# Patient Record
Sex: Male | Born: 1960 | Race: Black or African American | Hispanic: No | Marital: Single | Smoking: Never smoker
Health system: Southern US, Community
[De-identification: ages and names within clinical notes are randomized; demographics above are authoritative.]

## PROBLEM LIST (undated history)

## (undated) DIAGNOSIS — I10 Essential (primary) hypertension: Secondary | ICD-10-CM

## (undated) DIAGNOSIS — M1990 Unspecified osteoarthritis, unspecified site: Secondary | ICD-10-CM

## (undated) DIAGNOSIS — T7840XA Allergy, unspecified, initial encounter: Secondary | ICD-10-CM

## (undated) DIAGNOSIS — Z5189 Encounter for other specified aftercare: Secondary | ICD-10-CM

## (undated) HISTORY — DX: Unspecified osteoarthritis, unspecified site: M19.90

## (undated) HISTORY — DX: Essential (primary) hypertension: I10

## (undated) HISTORY — DX: Encounter for other specified aftercare: Z51.89

## (undated) HISTORY — PX: JOINT REPLACEMENT: SHX530

## (undated) HISTORY — DX: Allergy, unspecified, initial encounter: T78.40XA

---

## 2006-08-27 ENCOUNTER — Emergency Department (HOSPITAL_COMMUNITY): Admission: EM | Admit: 2006-08-27 | Discharge: 2006-08-27 | Payer: Self-pay | Admitting: Emergency Medicine

## 2007-11-11 HISTORY — PX: REPLACEMENT TOTAL KNEE BILATERAL: SUR1225

## 2007-12-20 ENCOUNTER — Ambulatory Visit: Payer: Self-pay | Admitting: General Practice

## 2007-12-31 ENCOUNTER — Ambulatory Visit: Payer: Self-pay | Admitting: Internal Medicine

## 2008-01-04 ENCOUNTER — Inpatient Hospital Stay: Payer: Self-pay | Admitting: General Practice

## 2008-01-21 ENCOUNTER — Emergency Department: Payer: Self-pay | Admitting: Unknown Physician Specialty

## 2008-01-21 ENCOUNTER — Other Ambulatory Visit: Payer: Self-pay

## 2009-06-19 IMAGING — CR DG KNEE 1-2V*R*
1 series · 2 of 2 positions shown · non-contrast
Comparison: none

REASON FOR EXAM: Post operative
COMMENTS:   Bedside (portable):Y

PROCEDURE:     DXR - DXR KNEE RIGHT AP AND LATERAL  - January 04, 2008  [DATE]
RESULT:     Images of the RIGHT knee show the patient is status post RIGHT
knee arthroplasty. The bony and hardware structures appear to be
unremarkable. Surgical drains and skin staples are present.

[Series 1: view not recorded · 0.17mm/px · 2 of 2 slices shown]
[im 1/2]
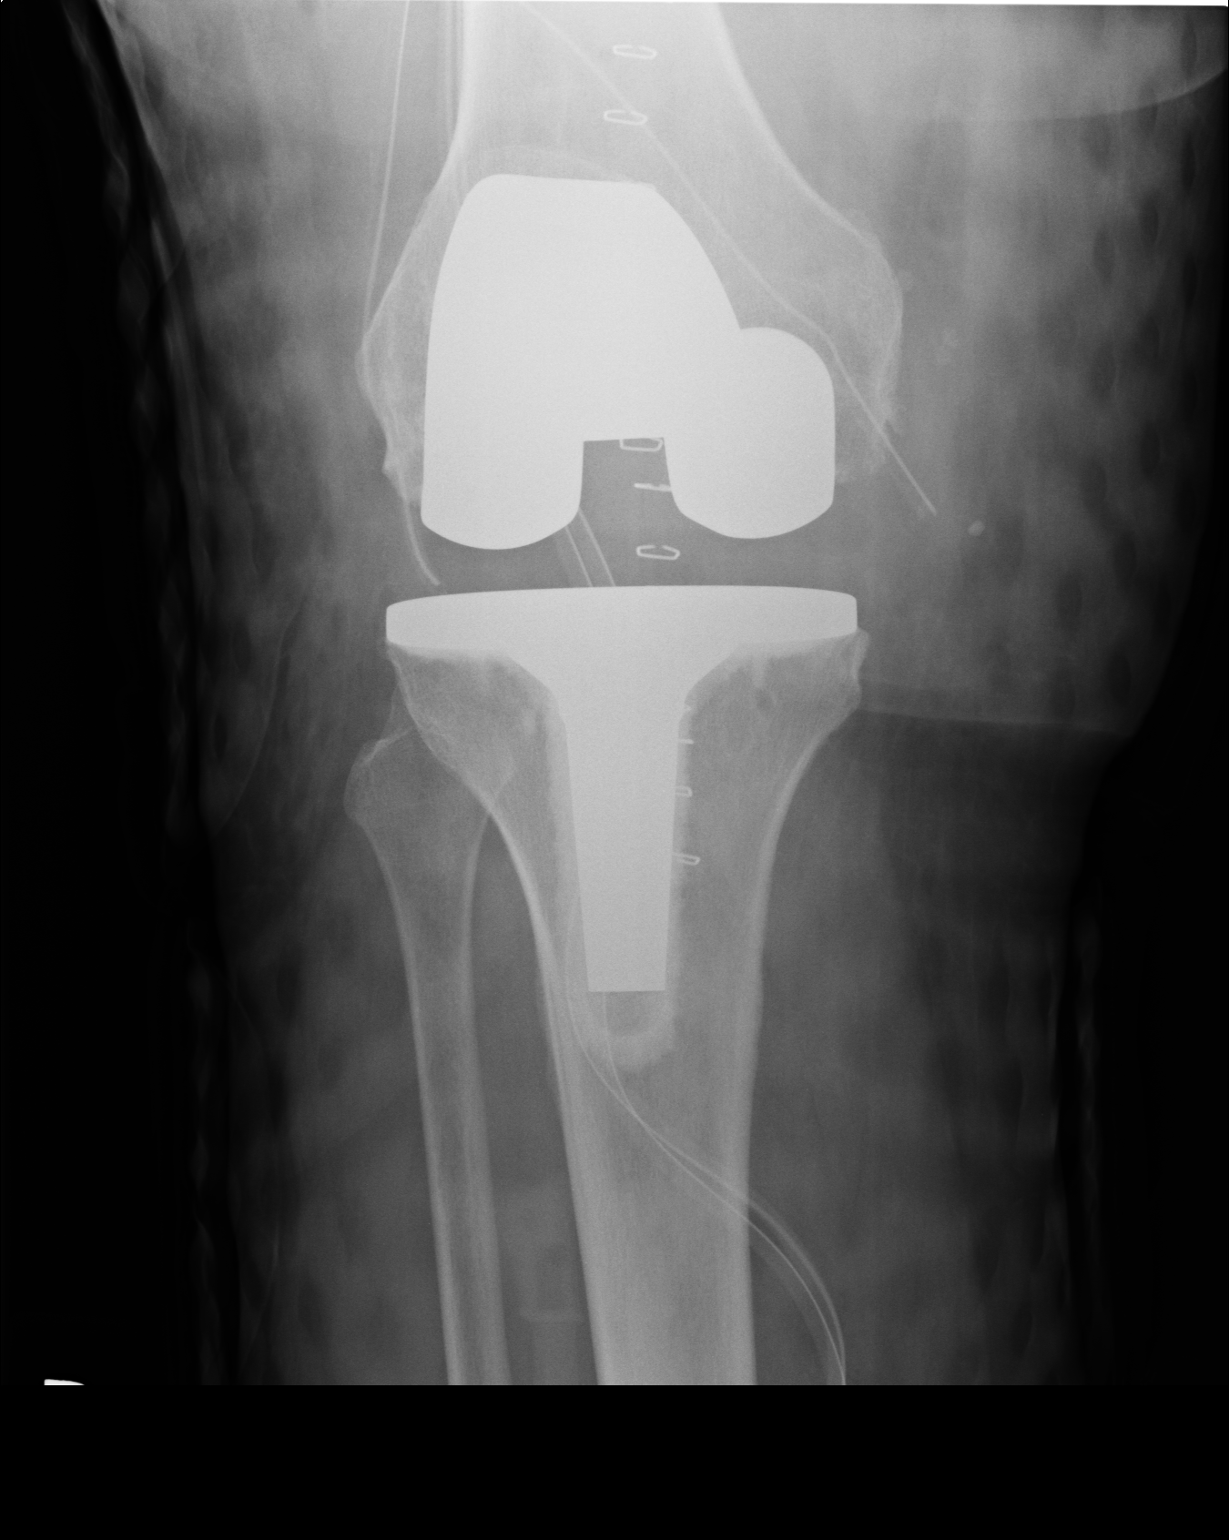
[im 2/2]
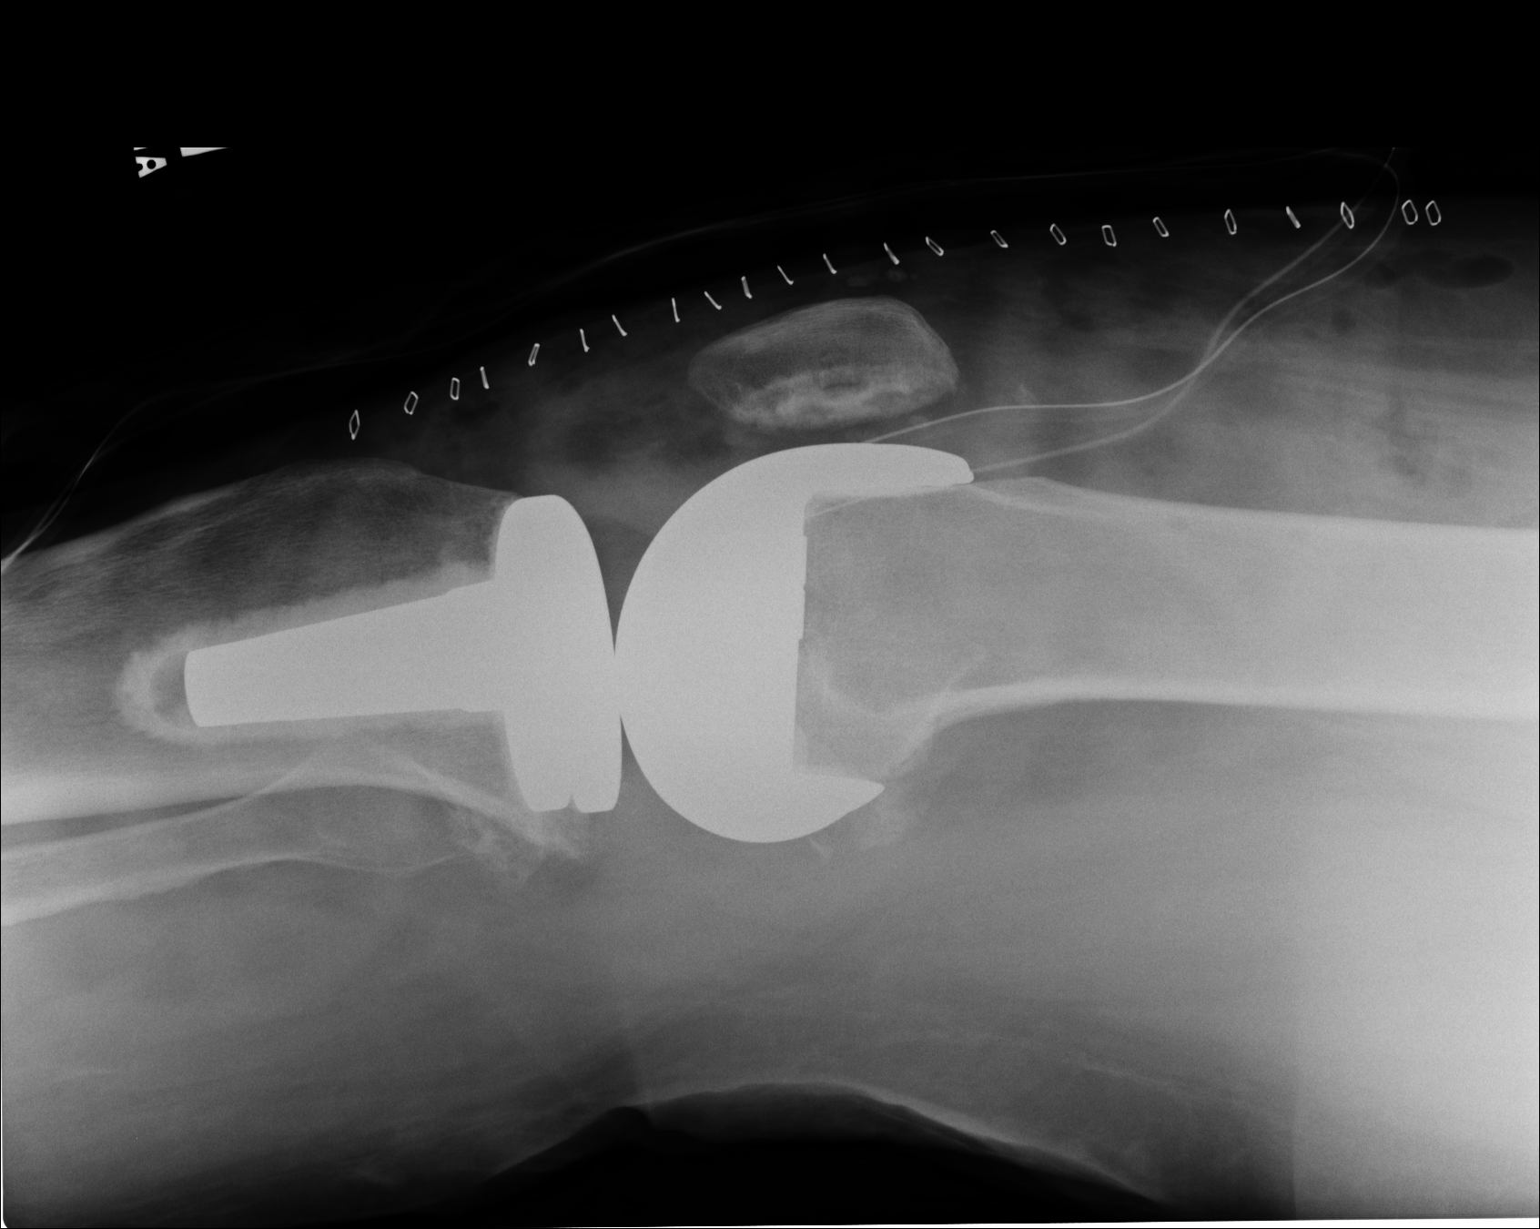

[2 of 2 positions shown; findings below may reference images not displayed]

IMPRESSION: Status post RIGHT knee arthroplasty. No immediate post
operative bone or hardware complication is demonstrated.

## 2009-11-10 HISTORY — PX: COLONOSCOPY: SHX174

## 2010-09-23 ENCOUNTER — Ambulatory Visit: Payer: Self-pay | Admitting: Gastroenterology

## 2010-09-23 LAB — HM COLONOSCOPY: HM COLON: NORMAL

## 2010-10-27 ENCOUNTER — Ambulatory Visit: Payer: Self-pay | Admitting: Internal Medicine

## 2010-12-19 ENCOUNTER — Ambulatory Visit: Payer: Self-pay | Admitting: Internal Medicine

## 2011-11-11 HISTORY — PX: ESOPHAGOGASTRODUODENOSCOPY: SHX1529

## 2012-05-03 ENCOUNTER — Ambulatory Visit: Payer: Self-pay | Admitting: Internal Medicine

## 2013-04-20 ENCOUNTER — Emergency Department: Payer: Self-pay | Admitting: Emergency Medicine

## 2013-10-17 IMAGING — US US ABDOMEN LIMITED SLG ORGAN/ASCITES
1 series · 14 of 25 positions shown · non-contrast
Comparison: none

REASON FOR EXAM: RUQ Epigastric Pain
COMMENTS:

PROCEDURE:     ESQUIVEL - ESQUIVEL ABDOMEN LTD 1 ORGAN OR QUAD  - May 03, 2012  [DATE]
RESULT:     Liver is normal. The pancreas is only partially visualized due
to overlying bowel gas. No focal abnormality noted. Gallbladder is normal.
Common bile duct diameter is 2.8 mm.

[Series 1: us abdomen limited slg organ/ascites · 0.31mm/px · 14 of 52 slices shown]
[im 1/52]
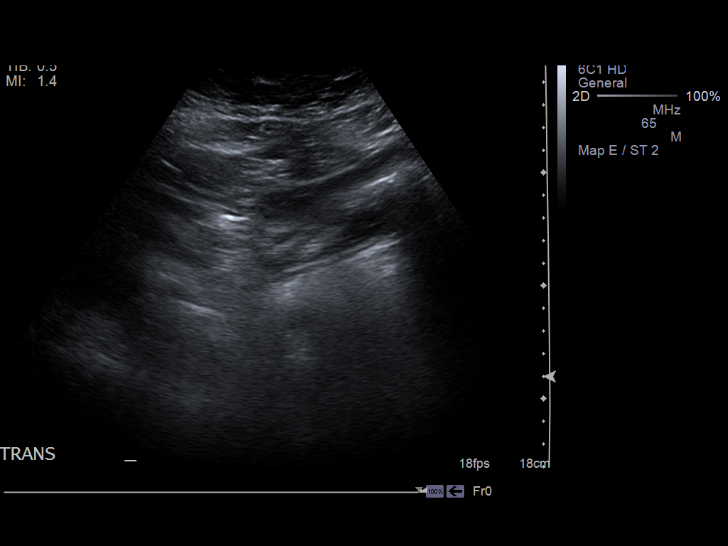
[im 5/52]
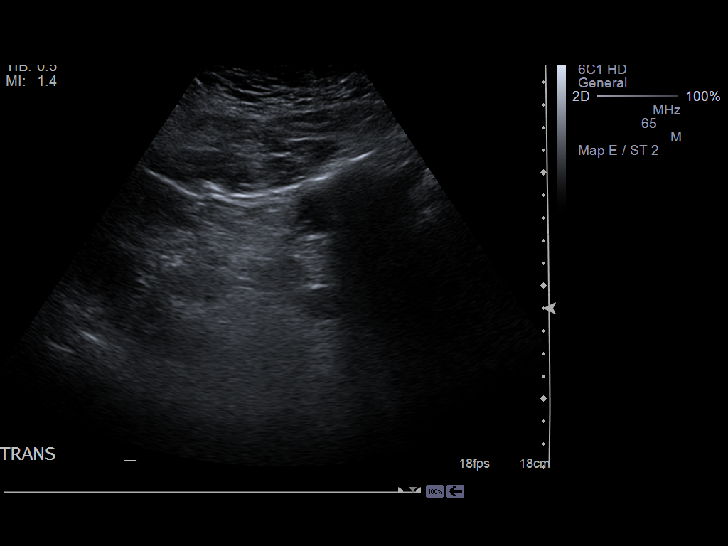
[im 9/52]
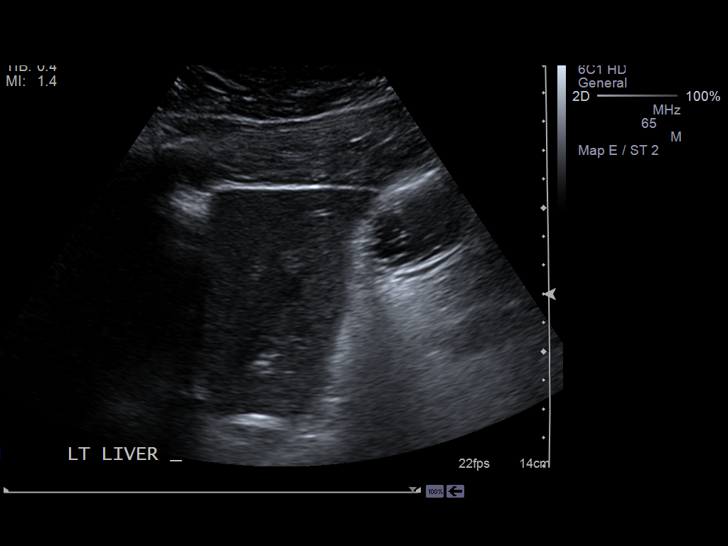
[im 13/52]
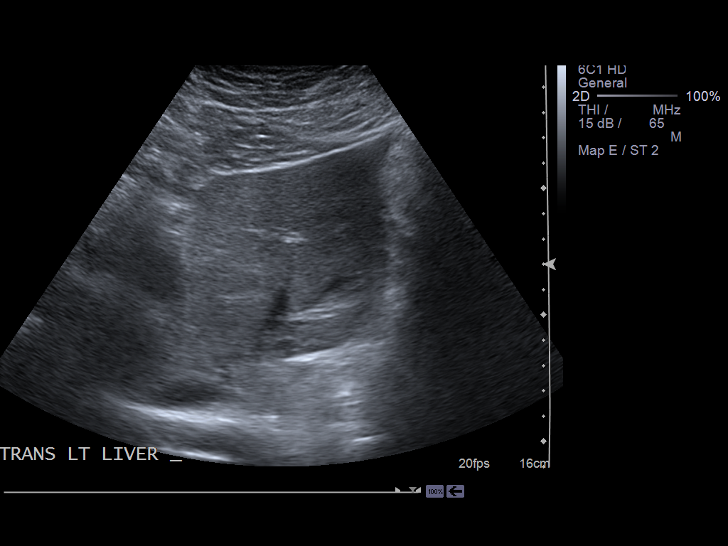
[im 18/52]
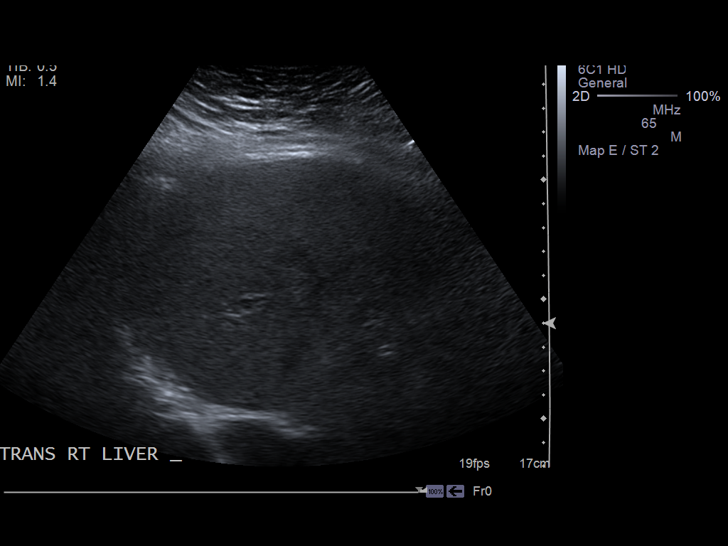
[im 20/52]
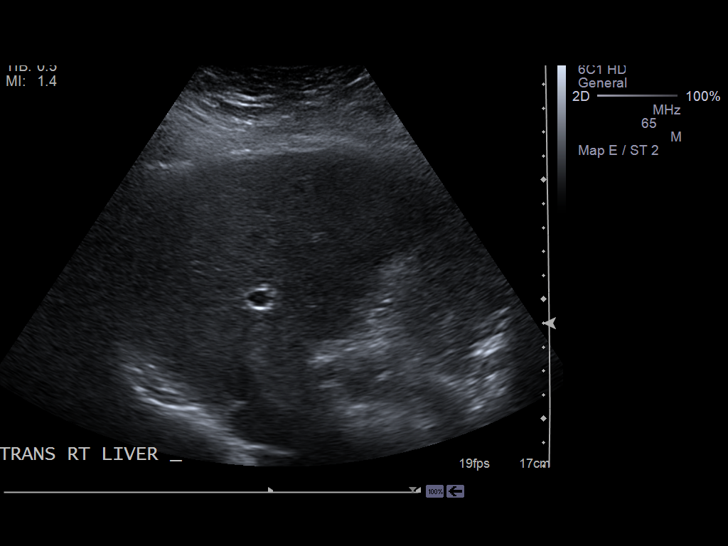
[im 24/52]
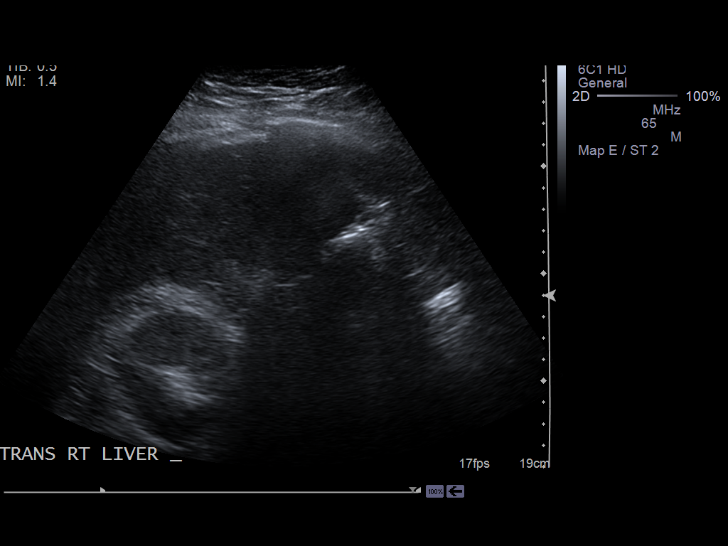
[im 28/52]
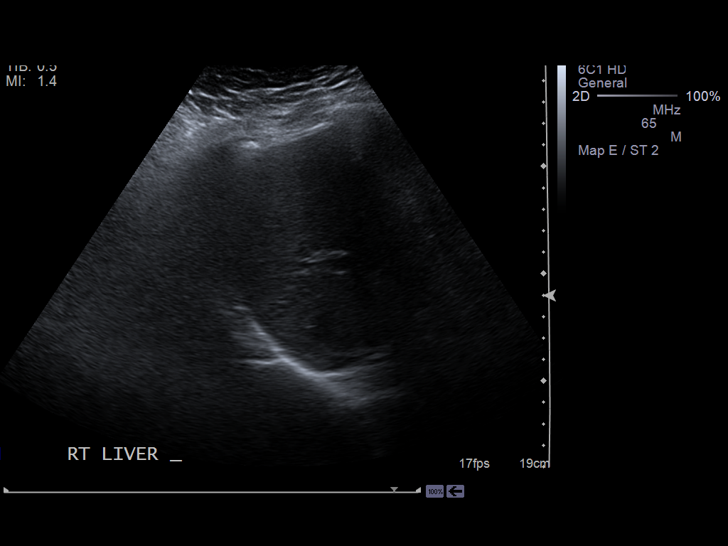
[im 32/52]
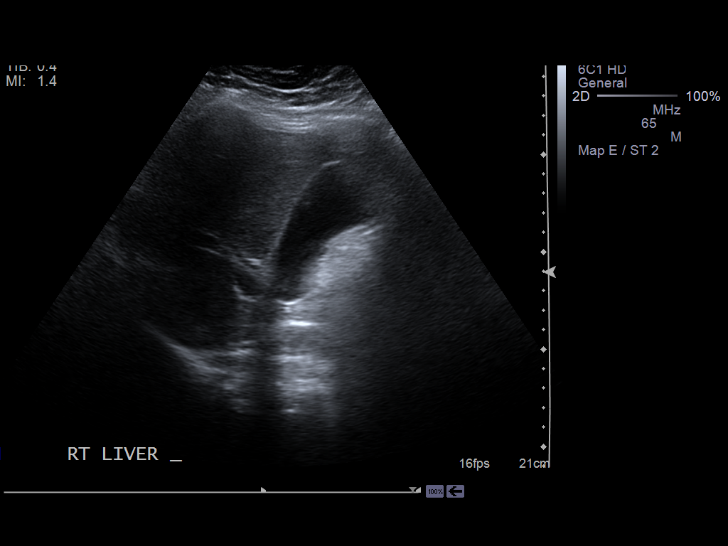
[im 35/52]
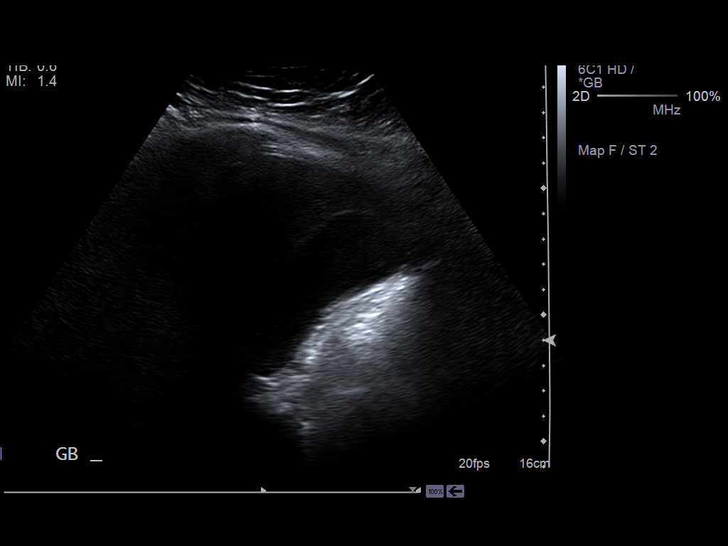
[im 39/52]
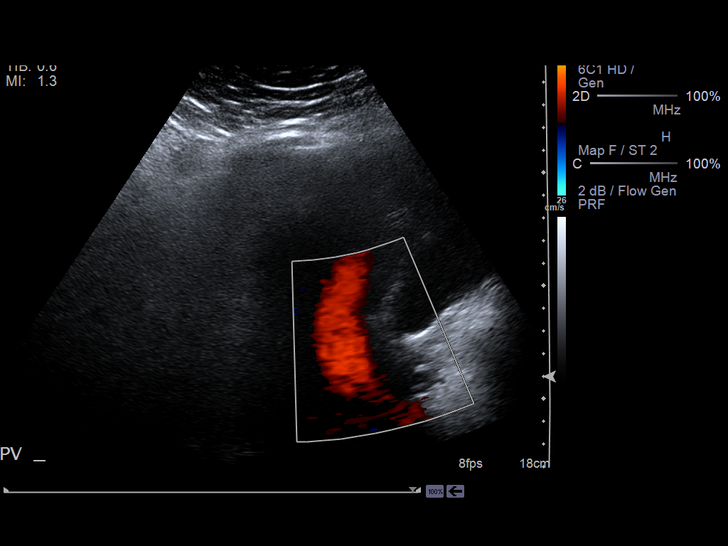
[im 43/52]
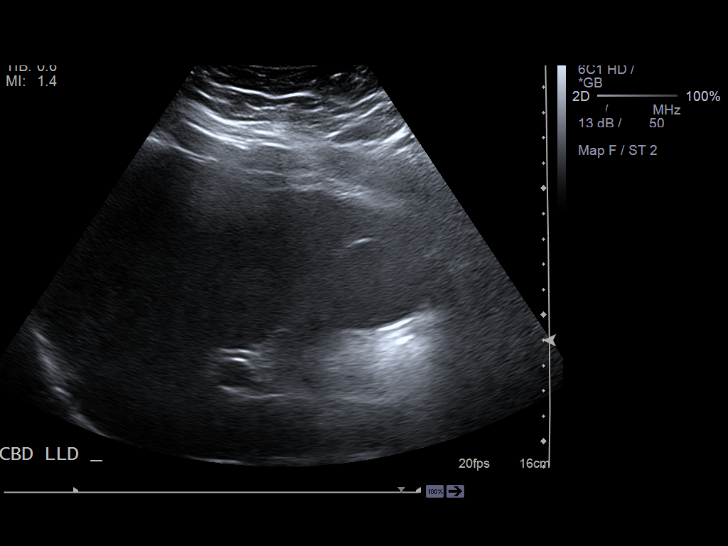
[im 47/52]
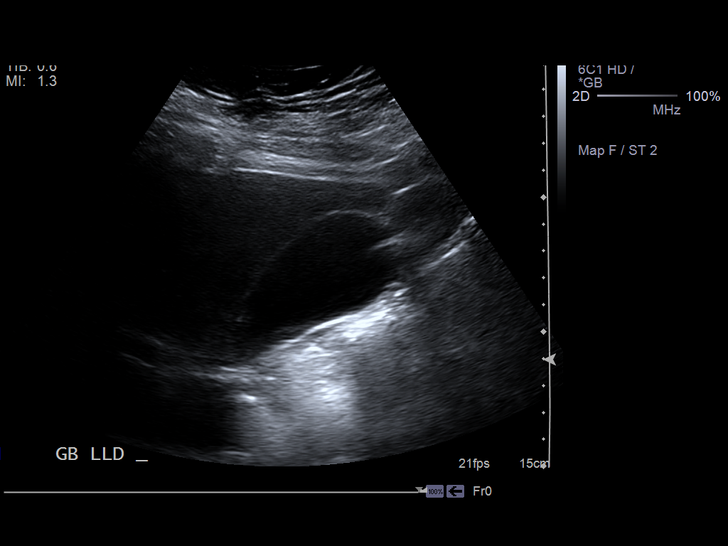
[im 52/52]
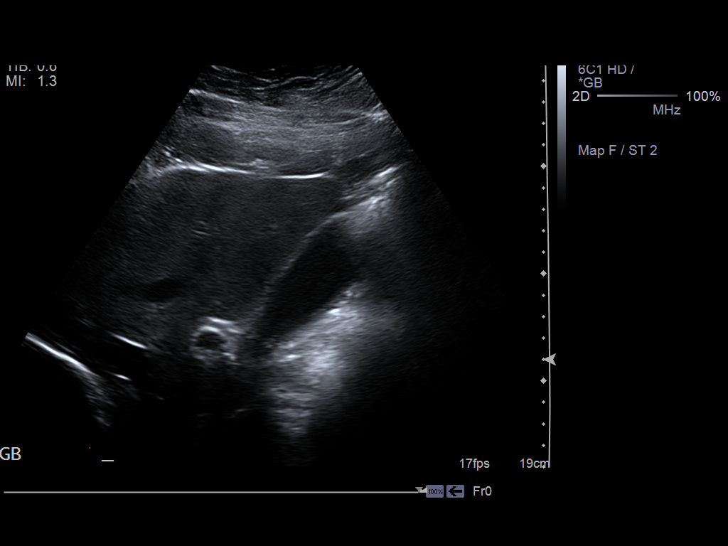

[14 of 25 positions shown; findings below may reference images not displayed]

IMPRESSION: Normal exam.

[REDACTED]

## 2014-09-13 LAB — LIPID PANEL
CHOLESTEROL: 176 mg/dL (ref 0–200)
HDL: 77 mg/dL — AB (ref 35–70)
LDL CALC: 83 mg/dL
TRIGLYCERIDES: 82 mg/dL (ref 40–160)

## 2014-09-13 LAB — PSA: PSA: 0.4

## 2014-09-14 LAB — CBC AND DIFFERENTIAL
HEMOGLOBIN: 15.8 g/dL (ref 13.5–17.5)
PLATELETS: 155 10*3/uL (ref 150–399)

## 2014-09-14 LAB — BASIC METABOLIC PANEL
BUN: 15 mg/dL (ref 4–21)
CREATININE: 1 mg/dL (ref <=1.3)

## 2015-05-01 ENCOUNTER — Encounter: Payer: Self-pay | Admitting: Internal Medicine

## 2015-05-01 ENCOUNTER — Ambulatory Visit (INDEPENDENT_AMBULATORY_CARE_PROVIDER_SITE_OTHER): Payer: Managed Care, Other (non HMO) | Admitting: Internal Medicine

## 2015-05-01 VITALS — BP 144/92 | HR 65 | Ht 72.0 in | Wt 357.0 lb

## 2015-05-01 DIAGNOSIS — H6983 Other specified disorders of Eustachian tube, bilateral: Secondary | ICD-10-CM | POA: Diagnosis not present

## 2015-05-01 DIAGNOSIS — D696 Thrombocytopenia, unspecified: Secondary | ICD-10-CM | POA: Insufficient documentation

## 2015-05-01 DIAGNOSIS — I10 Essential (primary) hypertension: Secondary | ICD-10-CM

## 2015-05-01 DIAGNOSIS — K219 Gastro-esophageal reflux disease without esophagitis: Secondary | ICD-10-CM | POA: Insufficient documentation

## 2015-05-01 DIAGNOSIS — M47894 Other spondylosis, thoracic region: Secondary | ICD-10-CM

## 2015-05-01 DIAGNOSIS — J3089 Other allergic rhinitis: Secondary | ICD-10-CM | POA: Insufficient documentation

## 2015-05-01 DIAGNOSIS — M479 Spondylosis, unspecified: Secondary | ICD-10-CM | POA: Insufficient documentation

## 2015-05-01 HISTORY — DX: Essential (primary) hypertension: I10

## 2015-05-01 MED ORDER — FLUTICASONE PROPIONATE 50 MCG/ACT NA SUSP: 2.0000 | Freq: Every day | NASAL | Status: AC

## 2015-05-01 MED ORDER — AMLODIPINE BESYLATE 2.5 MG PO TABS: 2.5000 mg | ORAL_TABLET | Freq: Every day | ORAL | Status: DC

## 2015-05-01 MED ORDER — GABAPENTIN 100 MG PO CAPS: 100.0000 mg | ORAL_CAPSULE | Freq: Every day | ORAL | Status: DC

## 2015-05-01 NOTE — Patient Instructions (Addendum)
Get plain Sudafed 30 mg  - take one in morning and one in the mid afternoon (5-7 days) or until relief Use flonase nasal spray daily   DASH Eating Plan DASH stands for "Dietary Approaches to Stop Hypertension." The DASH eating plan is a healthy eating plan that has been shown to reduce high blood pressure (hypertension). Additional health benefits may include reducing the risk of type 2 diabetes mellitus, heart disease, and stroke. The DASH eating plan may also help with weight loss. WHAT DO I NEED TO KNOW ABOUT THE DASH EATING PLAN? For the DASH eating plan, you will follow these general guidelines:  Choose foods with a percent daily value for sodium of less than 5% (as listed on the food label).  Use salt-free seasonings or herbs instead of table salt or sea salt.  Check with your health care provider or pharmacist before using salt substitutes.  Eat lower-sodium products, often labeled as "lower sodium" or "no salt added."  Eat fresh foods.  Eat more vegetables, fruits, and low-fat dairy products.  Choose whole grains. Look for the word "whole" as the first word in the ingredient list.  Choose fish and skinless chicken or [place] more often than red meat. Limit fish, poultry, and meat to 6 oz (170 g) each day.  Limit sweets, desserts, sugars, and sugary drinks.  Choose heart-healthy fats.  Limit cheese to 1 oz (28 g) per day.  Eat more home-cooked food and less restaurant, buffet, and fast food.  Limit fried foods.  Cook foods using methods other than frying.  Limit canned vegetables. If you do use them, rinse them well to decrease the sodium.  When eating at a restaurant, ask that your food be prepared with less salt, or no salt if possible. WHAT FOODS CAN I EAT? Seek help from a dietitian for individual calorie needs. Grains Whole grain or whole wheat bread. Brown rice. Whole grain or whole wheat pasta. Quinoa, bulgur, and whole grain cereals. Low-sodium cereals. Corn  or whole wheat flour tortillas. Whole grain cornbread. Whole grain crackers. Low-sodium crackers. Vegetables Fresh or frozen vegetables (raw, steamed, roasted, or grilled). Low-sodium or reduced-sodium tomato and vegetable juices. Low-sodium or reduced-sodium tomato sauce and paste. Low-sodium or reduced-sodium canned vegetables.  Fruits All fresh, canned (in natural juice), or frozen fruits. Meat and Other Protein Products Ground beef (85% or leaner), grass-fed beef, or beef trimmed of fat. Skinless chicken or [place]. Ground chicken or [place]. Pork trimmed of fat. All fish and seafood. Eggs. Dried beans, peas, or lentils. Unsalted nuts and seeds. Unsalted canned beans. Dairy Low-fat dairy products, such as skim or 1% milk, 2% or reduced-fat cheeses, low-fat ricotta or cottage cheese, or plain low-fat yogurt. Low-sodium or reduced-sodium cheeses. Fats and Oils Tub margarines without trans fats. Light or reduced-fat mayonnaise and salad dressings (reduced sodium). Avocado. Safflower, olive, or canola oils. Natural peanut or almond butter. Other Unsalted popcorn and pretzels. The items listed above may not be a complete list of recommended foods or beverages. Contact your dietitian for more options. WHAT FOODS ARE NOT RECOMMENDED? Grains White bread. White pasta. White rice. Refined cornbread. Bagels and croissants. Crackers that contain trans fat. Vegetables Creamed or fried vegetables. Vegetables in a cheese sauce. Regular canned vegetables. Regular canned tomato sauce and paste. Regular tomato and vegetable juices. Fruits Dried fruits. Canned fruit in light or heavy syrup. Fruit juice. Meat and Other Protein Products Fatty cuts of meat. Ribs, chicken wings, bacon, sausage, bologna, salami, chitterlings, fatback, hot  dogs, bratwurst, and packaged luncheon meats. Salted nuts and seeds. Canned beans with salt. Dairy Whole or 2% milk, cream, half-and-half, and cream cheese. Whole-fat or  sweetened yogurt. Full-fat cheeses or blue cheese. Nondairy creamers and whipped toppings. Processed cheese, cheese spreads, or cheese curds. Condiments Onion and garlic salt, seasoned salt, table salt, and sea salt. Canned and packaged gravies. Worcestershire sauce. Tartar sauce. Barbecue sauce. Teriyaki sauce. Soy sauce, including reduced sodium. Steak sauce. Fish sauce. Oyster sauce. Cocktail sauce. Horseradish. Ketchup and mustard. Meat flavorings and tenderizers. Bouillon cubes. Hot sauce. Tabasco sauce. Marinades. Taco seasonings. Relishes. Fats and Oils Butter, stick margarine, lard, shortening, ghee, and bacon fat. Coconut, palm kernel, or palm oils. Regular salad dressings. Other Pickles and olives. Salted popcorn and pretzels. The items listed above may not be a complete list of foods and beverages to avoid. Contact your dietitian for more information. WHERE CAN I FIND MORE INFORMATION? National Heart, Lung, and Blood Institute: [employer] Document Released: 10/16/2011 Document Revised: 03/13/2014 Document Reviewed: 08/31/2013 [facility] Patient Information 2015 [place], [place]. This information is not intended to replace advice given to you by your health care provider. Make sure you discuss any questions you have with your health care provider.

## 2015-05-01 NOTE — Progress Notes (Signed)
Date:  05/01/2015   Name:  Matthew Schaefer.   DOB:  1961-10-08   MRN:  [id number]   Chief Complaint: Ear pain Otalgia  There is pain in the left ear. This is a new problem. The current episode started in the past 7 days. The problem occurs constantly. The problem has been unchanged. There has been no fever. Associated symptoms include hearing loss and neck pain. Pertinent negatives include no abdominal pain, diarrhea, ear discharge, headaches, rash, rhinorrhea or sore throat. He has tried acetaminophen and ear drops for the symptoms. The treatment provided mild relief.  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 The problem has been waxing and waning since onset. Associated symptoms include neck pain. Pertinent negatives include no headaches, palpitations, peripheral edema or shortness of breath. There are no associated agents to hypertension. 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 The current treatment provides moderate improvement. There is no history of kidney disease or PVD.    Review of Systems:  Review of Systems  Constitutional: Negative for fever and unexpected weight change.  HENT: Positive for ear pain and hearing loss. Negative for ear discharge, postnasal drip, rhinorrhea, sinus pressure and sore throat.   Eyes: Negative for discharge and redness.  Respiratory: Negative for shortness of breath.   Cardiovascular: Negative for palpitations.  Gastrointestinal: Negative for abdominal pain and diarrhea.  Musculoskeletal: Positive for neck pain.  Skin: Negative for rash.  Neurological: Positive for dizziness (intermittent vertigo). Negative for headaches. Numbness: intermittent nerve discomfort in legs.  Psychiatric/Behavioral: Negative for dysphoric mood.    Patient Active Problem List   Diagnosis Date Noted  . Essential hypertension 05/01/2015   . Environmental and seasonal allergies 05/01/2015  . GERD without esophagitis 05/01/2015  . Osteoarthritis of spine 05/01/2015  . Thrombocytopenia 05/01/2015    Prior to Admission medications   Not on File    Allergies  Allergen Reactions  . Augmentin [Amoxicillin-Pot Clavulanate] Palpitations    Past Surgical History  Procedure Laterality Date  . Replacement total knee bilateral Bilateral 2009  . Esophagogastroduodenoscopy  2013    HH, gastritis, H Pylori (-)  . Colonoscopy  2011    normal    History  Substance Use Topics  . Smoking status: Never Smoker   . Smokeless tobacco: Current User    Types: Snuff  . Alcohol Use: No     Medication list has been reviewed and updated.  Physical Examination:  Physical Exam  Constitutional: He is oriented to person, place, and time. He appears well-developed and well-nourished.  HENT:  Right Ear: Hearing, external ear and ear canal normal. Tympanic membrane is retracted.  Left Ear: Hearing, external ear and ear canal normal. Tympanic membrane is retracted.  Eyes: Conjunctivae are normal. Pupils are equal, round, and reactive to light.  Neck: Normal range of motion. Neck supple. Carotid bruit is not present. No thyromegaly present.  Cardiovascular: Normal rate, regular rhythm and normal heart sounds.   Pulmonary/Chest: Breath sounds normal. He has no wheezes.  Musculoskeletal: He exhibits no edema.  Lymphadenopathy:    He has no cervical adenopathy.  Neurological: He is alert and oriented to person, place, and time.  Psychiatric: He has a normal mood and affect.    BP 144/92 mmHg  Pulse 65  Ht 6' (1.829 m)  Wt 357 lb (161.934 kg)  BMI 48.41 kg/m2  SpO2 97%  Assessment and Plan: 1. Eustachian tube dysfunction, bilateral Begin sudafed 30 mg bid for 5-7 days - fluticasone (FLONASE) 50 MCG/ACT nasal spray; Place 2 sprays into both nostrils daily.  Dispense: 16 g; Refill: 2  2. Other osteoarthritis of spine, thoracic  region Will refill medication to use as needed - gabapentin (NEURONTIN) 100 MG capsule; Take 1 capsule (100 mg total) by mouth at bedtime.  Dispense: 30 capsule; Refill: 2  3. Essential hypertension Need to begin therapy - amLODipine (NORVASC) 2.5 MG tablet; Take 1 tablet (2.5 mg total) by mouth daily.  Dispense: 30 tablet; Refill: 5   Bari Edward, MD [facility]  05/01/2015

## 2015-05-22 ENCOUNTER — Ambulatory Visit
Admission: EM | Admit: 2015-05-22 | Discharge: 2015-05-22 | Disposition: A | Discharge status: Home / Self Care | Payer: Managed Care, Other (non HMO) | Attending: Family Medicine | Admitting: Family Medicine

## 2015-05-22 ENCOUNTER — Encounter: Payer: Self-pay | Admitting: *Deleted

## 2015-05-22 DIAGNOSIS — L509 Urticaria, unspecified: Secondary | ICD-10-CM | POA: Diagnosis not present

## 2015-05-22 MED ORDER — PREDNISONE 20 MG PO TABS: 20.0000 mg | ORAL_TABLET | Freq: Every day | ORAL | Status: DC

## 2015-05-22 NOTE — ED Notes (Signed)
Pt states "hives on both legs, started this afternoon, thought maybe from the heat, not sure what it is, not sure if its anywhere else"

## 2015-05-22 NOTE — ED Provider Notes (Signed)
CSN: [id number]     Arrival date & time 05/22/15  1655 History   First MD Initiated Contact with Patient 05/22/15 1721     Chief Complaint  Patient presents with  . Urticaria   (Consider location/radiation/quality/duration/timing/severity/associated sxs/prior Treatment) Patient is a 54 y.o. male presenting with urticaria. The history is provided by the patient.  Urticaria This is a new problem. The current episode started 1 to 2 hours ago (unsure cause; no new meds, soaps, detergents, plant contact, foods; has had a mild runny nose and congestion recently). The problem occurs constantly. The problem has not changed since onset.Associated symptoms comments: Itching; denies any wheezing, shortness of breath, facial or tongue swelling.    History reviewed. No pertinent past medical history. Past Surgical History  Procedure Laterality Date  . Replacement total knee bilateral Bilateral 2009  . Esophagogastroduodenoscopy  2013    HH, gastritis, H Pylori (-)  . Colonoscopy  2011    normal   Family History  Problem Relation Age of Onset  . Diabetes Father   . Pancreatic cancer Father    History  Substance Use Topics  . Smoking status: Never Smoker   . Smokeless tobacco: Current User    Types: Snuff  . Alcohol Use: No    Review of Systems  Allergies  Augmentin  Home Medications   Prior to Admission medications   Medication Sig Start Date End Date Taking? Authorizing Provider  amLODipine (NORVASC) 2.5 MG tablet Take 1 tablet (2.5 mg total) by mouth daily. 05/01/15  Yes Reubin MilanLaura H Berglund, MD  fluticasone (FLONASE) 50 MCG/ACT nasal spray Place 2 sprays into both nostrils daily. 05/01/15  Yes Reubin MilanLaura H Berglund, MD  gabapentin (NEURONTIN) 100 MG capsule Take 1 capsule (100 mg total) by mouth at bedtime. 05/01/15  Yes Reubin MilanLaura H Berglund, MD  Multiple Vitamin (MULTIVITAMIN) tablet Take 1 tablet by mouth daily.   Yes Historical Provider, MD  predniSONE (DELTASONE) 20 MG tablet Take 1 tablet  (20 mg total) by mouth daily. 05/22/15   Payton Mccallumrlando Bassy Fetterly, MD   BP 135/82 mmHg  Pulse 68  Temp(Src) 98.2 F (36.8 C) (Oral)  Resp 16  Ht 6' (1.829 m)  Wt 357 lb (161.934 kg)  BMI 48.41 kg/m2  SpO2 97% Physical Exam  Constitutional: He appears well-developed and well-nourished. No distress.  HENT:  Head: Normocephalic and atraumatic.  Right Ear: Tympanic membrane, external ear and ear canal normal.  Left Ear: Tympanic membrane, external ear and ear canal normal.  Nose: Nose normal.  Mouth/Throat: Uvula is midline, oropharynx is clear and moist and mucous membranes are normal. No oropharyngeal exudate or tonsillar abscesses.  Eyes: Conjunctivae and EOM are normal. Pupils are equal, round, and reactive to light. Right eye exhibits no discharge. Left eye exhibits no discharge. No scleral icterus.  Neck: Normal range of motion. Neck supple. No tracheal deviation present. No thyromegaly present.  Cardiovascular: Normal rate, regular rhythm and normal heart sounds.   Pulmonary/Chest: Effort normal and breath sounds normal. No stridor. No respiratory distress. He has no wheezes. He has no rales. He exhibits no tenderness.  Lymphadenopathy:    He has no cervical adenopathy.  Neurological: He is alert.  Skin: Skin is warm and dry. Rash noted. Rash is urticarial. He is not diaphoretic.     Urticarial erythematous hives, rash on inner thighs bilaterally  Nursing note and vitals reviewed.   ED Course  Procedures (including critical care time) Labs Review Labs Reviewed - No data to display  Imaging Review No results found.   MDM   1. Urticaria   (unknown etiology; possibly mild current viral URI)   New Prescriptions   PREDNISONE (DELTASONE) 20 MG TABLET    Take 1 tablet (20 mg total) by mouth daily.   Plan: 1. diagnosis reviewed with patient 2. rx as per orders; risks, benefits, potential side effects reviewed with patient 3. Recommend supportive treatment with otc antihistamines  and H2 blocker 4. F/u prn if symptoms worsen or don't improve    Payton Mccallum, MD 05/22/15 1745

## 2015-08-18 ENCOUNTER — Encounter: Payer: Self-pay | Admitting: Internal Medicine

## 2015-09-03 ENCOUNTER — Ambulatory Visit (INDEPENDENT_AMBULATORY_CARE_PROVIDER_SITE_OTHER): Payer: Managed Care, Other (non HMO) | Admitting: Internal Medicine

## 2015-09-03 ENCOUNTER — Encounter: Payer: Self-pay | Admitting: Internal Medicine

## 2015-09-03 VITALS — BP 120/72 | HR 64 | Ht 72.0 in | Wt 365.4 lb

## 2015-09-03 DIAGNOSIS — K219 Gastro-esophageal reflux disease without esophagitis: Secondary | ICD-10-CM | POA: Diagnosis not present

## 2015-09-03 DIAGNOSIS — J3089 Other allergic rhinitis: Secondary | ICD-10-CM | POA: Diagnosis not present

## 2015-09-03 DIAGNOSIS — I10 Essential (primary) hypertension: Secondary | ICD-10-CM

## 2015-09-03 DIAGNOSIS — Z0000 Encounter for general adult medical examination without abnormal findings: Secondary | ICD-10-CM

## 2015-09-03 DIAGNOSIS — D696 Thrombocytopenia, unspecified: Secondary | ICD-10-CM

## 2015-09-03 DIAGNOSIS — Z125 Encounter for screening for malignant neoplasm of prostate: Secondary | ICD-10-CM

## 2015-09-03 LAB — POCT URINALYSIS DIPSTICK
BILIRUBIN UA: NEGATIVE
Glucose, UA: NEGATIVE
KETONES UA: NEGATIVE
LEUKOCYTES UA: NEGATIVE
NITRITE UA: NEGATIVE
PH UA: 5
PROTEIN UA: NEGATIVE
RBC UA: NEGATIVE
Spec Grav, UA: 1.01
Urobilinogen, UA: 0.2

## 2015-09-03 NOTE — Addendum Note (Signed)
Addended by: Reubin MilanBERGLUND, Jeremyah Jelley H on: 09/03/2015 01:11 PM   Modules accepted: Kipp BroodSmartSet

## 2015-09-03 NOTE — Progress Notes (Signed)
Date:  09/03/2015   Name:  Matthew Schaefer.   DOB:  1961/02/20   MRN:  [id number]   Chief Complaint: Annual Exam [name]. is a 54 y.o. male who presents today for his Complete Annual Exam. He feels well. He reports exercising daily - cardio and weights. He reports he is sleeping well. He is a little frustrated that he is unable to lose weight despite a low carb diet and exercise.  Hypertension This is a chronic problem. The current episode started more than 1 year ago. The problem is unchanged. The problem is controlled. Pertinent negatives include no chest pain, headaches, palpitations or shortness of breath. There are no associated agents to hypertension. Past treatments include calcium channel blockers. The current treatment provides moderate improvement.   Thrombocytopenia - patient has a long history of borderline thrombocytopenia. 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 He does note occasional bruising with minimal injury. No spontaneous bleeding.  Review of Systems  Constitutional: Negative for chills, diaphoresis and fatigue.  HENT: Negative for hearing loss, tinnitus and trouble swallowing.   Eyes: Negative for visual disturbance.  Respiratory: Negative for cough, chest tightness and shortness of breath.   Cardiovascular: Negative for chest pain, palpitations and leg swelling.  Gastrointestinal: Negative for diarrhea, constipation and blood in stool.  Genitourinary: Negative for urgency, frequency, hematuria and difficulty urinating.  Musculoskeletal: Positive for myalgias. Negative for joint swelling and gait problem.  Skin: Negative for color change and rash.  Neurological: Negative for tremors, weakness and headaches.  Hematological: Negative for adenopathy. Does not bruise/bleed easily.  Psychiatric/Behavioral: Negative for sleep disturbance and dysphoric mood.    Patient Active Problem List   Diagnosis Date Noted  .  Essential hypertension 05/01/2015  . Environmental and seasonal allergies 05/01/2015  . GERD without esophagitis 05/01/2015  . Osteoarthritis of spine 05/01/2015  . Thrombocytopenia (HCC) 05/01/2015    Prior to Admission medications   Medication Sig Start Date End Date Taking? Authorizing Provider  amLODipine (NORVASC) 2.5 MG tablet Take 1 tablet (2.5 mg total) by mouth daily. 05/01/15  Yes Reubin Milan, MD  fluticasone (FLONASE) 50 MCG/ACT nasal spray Place 2 sprays into both nostrils daily. 05/01/15  Yes Reubin Milan, MD  gabapentin (NEURONTIN) 100 MG capsule Take 1 capsule (100 mg total) by mouth at bedtime. 05/01/15  Yes Reubin Milan, MD  Multiple Vitamin (MULTIVITAMIN) tablet Take 1 tablet by mouth daily.   Yes Historical Provider, MD    Allergies  Allergen Reactions  . Augmentin [Amoxicillin-Pot Clavulanate] Palpitations    Past Surgical History  Procedure Laterality Date  . Replacement total knee bilateral Bilateral 2009  . Esophagogastroduodenoscopy  2013    HH, gastritis, H Pylori (-)  . Colonoscopy  2011    normal    Social History  Substance Use Topics  . Smoking status: Never Smoker   . Smokeless tobacco: Current User    Types: Snuff  . Alcohol Use: No     Medication list has been reviewed and updated.   Physical Exam  Constitutional: He is oriented to person, place, and time. He appears well-developed. No distress.  HENT:  Head: Normocephalic and atraumatic.  Right Ear: Tympanic membrane and ear canal normal.  Left Ear: Tympanic membrane and ear canal normal.  Mouth/Throat: Uvula is midline and oropharynx is clear and moist.  Eyes: Conjunctivae are normal. Right eye exhibits no discharge. Left eye exhibits no discharge. No scleral  icterus.  Neck: Normal range of motion. Neck supple. No thyromegaly present.  Cardiovascular: Normal rate, regular rhythm, normal heart sounds and intact distal pulses.   Pulmonary/Chest: Effort normal and breath  sounds normal. No respiratory distress. He has no wheezes. He has no rales. Right breast exhibits no mass. Left breast exhibits no mass.  Abdominal: Soft. Bowel sounds are normal. He exhibits no mass. There is no tenderness. There is no rebound, no guarding and no CVA tenderness.  Musculoskeletal: Normal range of motion. He exhibits no edema.       Right knee: Normal.       Left knee: Normal.  Lymphadenopathy:    He has no cervical adenopathy.  Neurological: He is alert and oriented to person, place, and time. He has normal reflexes.  Skin: Skin is warm, dry and intact. No rash noted.  Psychiatric: He has a normal mood and affect. His speech is normal and behavior is normal. Thought content normal.  Nursing note and vitals reviewed.   BP 134/94 mmHg  Pulse 64  Ht 6' (1.829 m)  Wt 365 lb 6.4 oz (165.744 kg)  BMI 49.55 kg/m2  Assessment and Plan: 1. Annual physical exam Colonoscopy is up-to-date Patient declines flu vaccine - POCT urinalysis dipstick - Lipid panel  2. Essential hypertension Controlled on low-dose amlodipine - Comprehensive metabolic panel - TSH  3. GERD without esophagitis Intermittent symptoms relieved by over-the-counter medication  4. Thrombocytopenia (HCC) Appears stable without significant symptoms - CBC with Differential/Platelet  5. Environmental and seasonal allergies Continue Flonase  6. Prostate cancer screening DRE deferred due to lack of symptoms - PSA   Bari EdwardLaura Alonie Gazzola, MD [facility]  09/03/2015

## 2015-09-04 LAB — LIPID PANEL
Chol/HDL Ratio: 2.7 ratio units (ref 0.0–5.0)
Cholesterol, Total: 184 mg/dL (ref 100–199)
HDL: 69 mg/dL (ref >39)
LDL Calculated: 101 mg/dL — ABNORMAL HIGH (ref 0–99)
Triglycerides: 68 mg/dL (ref 0–149)
VLDL Cholesterol Cal: 14 mg/dL (ref 5–40)

## 2015-09-04 LAB — COMPREHENSIVE METABOLIC PANEL
A/G RATIO: 1.4 (ref 1.1–2.5)
ALT: 17 IU/L (ref 0–44)
AST: 21 IU/L (ref 0–40)
Albumin: 3.8 g/dL (ref 3.5–5.5)
Alkaline Phosphatase: 74 IU/L (ref 39–117)
BILIRUBIN TOTAL: 0.7 mg/dL (ref 0.0–1.2)
BUN/Creatinine Ratio: 16 (ref 9–20)
BUN: 15 mg/dL (ref 6–24)
CHLORIDE: 102 mmol/L (ref 97–106)
CO2: 25 mmol/L (ref 18–29)
Calcium: 9 mg/dL (ref 8.7–10.2)
Creatinine, Ser: 0.96 mg/dL (ref 0.76–1.27)
GFR calc non Af Amer: 88 mL/min/{1.73_m2} (ref >59)
GFR, EST AFRICAN AMERICAN: 102 mL/min/{1.73_m2} (ref >59)
Globulin, Total: 2.7 g/dL (ref 1.5–4.5)
Glucose: 92 mg/dL (ref 65–99)
POTASSIUM: 4.5 mmol/L (ref 3.5–5.2)
Sodium: 139 mmol/L (ref 136–144)
TOTAL PROTEIN: 6.5 g/dL (ref 6.0–8.5)

## 2015-09-04 LAB — CBC WITH DIFFERENTIAL/PLATELET
BASOS: 1 %
Basophils Absolute: 0 10*3/uL (ref 0.0–0.2)
EOS (ABSOLUTE): 0.1 10*3/uL (ref 0.0–0.4)
Eos: 3 %
Hematocrit: 44.9 % (ref 37.5–51.0)
Hemoglobin: 15.4 g/dL (ref 12.6–17.7)
Immature Grans (Abs): 0 10*3/uL (ref 0.0–0.1)
Immature Granulocytes: 0 %
Lymphocytes Absolute: 1.5 10*3/uL (ref 0.7–3.1)
Lymphs: 43 %
MCH: 29.1 pg (ref 26.6–33.0)
MCHC: 34.3 g/dL (ref 31.5–35.7)
MCV: 85 fL (ref 79–97)
MONOS ABS: 0.6 10*3/uL (ref 0.1–0.9)
Monocytes: 16 %
NEUTROS ABS: 1.3 10*3/uL — AB (ref 1.4–7.0)
Neutrophils: 37 %
PLATELETS: 156 10*3/uL (ref 150–379)
RBC: 5.29 x10E6/uL (ref 4.14–5.80)
RDW: 14.8 % (ref 12.3–15.4)
WBC: 3.4 10*3/uL (ref 3.4–10.8)

## 2015-09-04 LAB — TSH: TSH: 1.19 u[IU]/mL (ref 0.450–4.500)

## 2015-09-04 LAB — PSA: PROSTATE SPECIFIC AG, SERUM: 0.3 ng/mL (ref 0.0–4.0)

## 2015-10-31 ENCOUNTER — Ambulatory Visit
Admission: EM | Admit: 2015-10-31 | Discharge: 2015-10-31 | Disposition: A | Discharge status: Home / Self Care | Payer: Managed Care, Other (non HMO) | Attending: Family Medicine | Admitting: Family Medicine

## 2015-10-31 ENCOUNTER — Encounter: Payer: Self-pay | Admitting: *Deleted

## 2015-10-31 DIAGNOSIS — L02219 Cutaneous abscess of trunk, unspecified: Secondary | ICD-10-CM | POA: Diagnosis not present

## 2015-10-31 DIAGNOSIS — L03319 Cellulitis of trunk, unspecified: Secondary | ICD-10-CM

## 2015-10-31 HISTORY — DX: Essential (primary) hypertension: I10

## 2015-10-31 MED ORDER — CEFTRIAXONE SODIUM 1 G IJ SOLR
1.0000 g | Freq: Once | INTRAMUSCULAR | Status: AC
Start: 2015-10-31 — End: 2015-10-31
  Administered 2015-10-31: 1 g via INTRAMUSCULAR

## 2015-10-31 MED ORDER — SULFAMETHOXAZOLE-TRIMETHOPRIM 800-160 MG PO TABS: 1.0000 | ORAL_TABLET | Freq: Two times a day (BID) | ORAL | Status: DC

## 2015-10-31 MED ORDER — MUPIROCIN 2 % EX OINT
1.0000 | TOPICAL_OINTMENT | Freq: Three times a day (TID) | CUTANEOUS | Status: DC
Start: 2015-10-31 — End: 2016-03-03

## 2015-10-31 NOTE — ED Notes (Signed)
Patient noticed a possible insect bite on his umbilicus two days ago. The umbilicus is red and swollen with a report of a burning sensation. No pain reported. Patient states that he was lifting a heavy package and brushed it against his abdomen when he first noticed the rash.

## 2015-10-31 NOTE — ED Provider Notes (Signed)
CSN: [id number]     Arrival date & time 10/31/15  1508 History   First MD Initiated Contact with Patient 10/31/15 1628    Nurses notes were reviewed. Chief Complaint  Patient presents with  . Insect Bite   Patient is a protuberant black male who reports pain over his navel on Monday. Over the weekend he traumatized his abdomen from trying to catch a box while he was at [facility]. He also reports encountering some spiders when he was putting up a tree for Christmas recently. We do not know for sure but skin but he is complaining of pain over the lower part of his abdomen mostly inferior to the navel.  History  (Consider location/radiation/quality/duration/timing/severity/associated sxs/prior Treatment) Patient is a 54 y.o. male presenting with abscess. The history is provided by the patient. No language interpreter was used.  Abscess Location:  Torso Torso abscess location:  Abd LLQ and abd RLQ (The area of cellulitis spreads from the umbilicus to a circular area involving the right and left lower abdomen. There is a markedly thickened area about 3 cm from the umbilicus which appeared to be the first site of inflammation and infection.) Red streaking: yes   Duration:  3 days Progression:  Worsening Chronicity:  New Context: insect bite/sting and skin injury   Context: not diabetes, not immunosuppression and not injected drug use   Relieved by:  Nothing Ineffective treatments:  None tried Associated symptoms: no anorexia, no fatigue, no fever, no headaches, no nausea and no vomiting   Risk factors: no family hx of MRSA, no hx of MRSA and no prior abscess     Past Medical History  Diagnosis Date  . Hypertension    Past Surgical History  Procedure Laterality Date  . Replacement total knee bilateral Bilateral 2009  . Esophagogastroduodenoscopy  2013    HH, gastritis, H Pylori (-)  . Colonoscopy  2011    normal   Family History  Problem Relation Age of Onset  .  Diabetes Father   . Pancreatic cancer Father    Social History  Substance Use Topics  . Smoking status: Never Smoker   . Smokeless tobacco: Current User    Types: Snuff  . Alcohol Use: No    Review of Systems  Constitutional: Negative for fever and fatigue.  Gastrointestinal: Negative for nausea, vomiting and anorexia.  Neurological: Negative for headaches.  All other systems reviewed and are negative.   Allergies  Augmentin  Home Medications   Prior to Admission medications   Medication Sig Start Date End Date Taking? Authorizing Provider  amLODipine (NORVASC) 2.5 MG tablet Take 1 tablet (2.5 mg total) by mouth daily. 05/01/15  Yes Reubin MilanLaura H Berglund, MD  fluticasone (FLONASE) 50 MCG/ACT nasal spray Place 2 sprays into both nostrils daily. 05/01/15  Yes Reubin MilanLaura H Berglund, MD  gabapentin (NEURONTIN) 100 MG capsule Take 1 capsule (100 mg total) by mouth at bedtime. 05/01/15  Yes Reubin MilanLaura H Berglund, MD  Multiple Vitamin (MULTIVITAMIN) tablet Take 1 tablet by mouth daily.   Yes Historical Provider, MD  mupirocin ointment (BACTROBAN) 2 % Apply 1 application topically 3 (three) times daily. 10/31/15   Hassan RowanEugene Jaielle Dlouhy, MD  sulfamethoxazole-trimethoprim (BACTRIM DS,SEPTRA DS) 800-160 MG tablet Take 1 tablet by mouth 2 (two) times daily. 10/31/15   Hassan RowanEugene Jina Olenick, MD   Meds Ordered and Administered this Visit   Medications  cefTRIAXone (ROCEPHIN) injection 1 g (not administered)    BP 151/85 mmHg  Pulse 56  Temp(Src)  98.3 F (36.8 C) (Oral)  Resp 18  Ht 5' 11.5" (1.816 m)  Wt 355 lb (161.027 kg)  BMI 48.83 kg/m2  SpO2 97% No data found.   Physical Exam  Constitutional: He appears well-developed and well-nourished.  HENT:  Head: Normocephalic and atraumatic.  Eyes: Conjunctivae are normal. Pupils are equal, round, and reactive to light.  Abdominal: Soft. There is tenderness.    Musculoskeletal: Normal range of motion.  Neurological: He is alert.  Skin: Skin is warm and dry. There  is erythema.  Psychiatric: He has a normal mood and affect. His behavior is normal.  Vitals reviewed.   ED Course  Procedures (including critical care time)  Labs Review Labs Reviewed - No data to display  Imaging Review No results found.   Visual Acuity Review  Right Eye Distance:   Left Eye Distance:   Bilateral Distance:    Right Eye Near:   Left Eye Near:    Bilateral Near:         MDM   1. Cellulitis and abscess of trunk    Patient will be given a gram of Rocephin IM here. Place on Bactroban ointment and Septra DS. Explained to patient that he should be better in the next 48 hours but if he is not or if he gets suddenly worse will need to go to the ED of his choice for probable IV antibiotics and admission. We'll give him a work note for tomorrow and Friday as well.    Hassan Rowan, MD 10/31/15 [phone]

## 2015-10-31 NOTE — Discharge Instructions (Signed)
Cellulitis °Cellulitis is an infection of the skin and the tissue under the skin. The infected area is usually red and tender. This happens most often in the arms and lower legs. °HOME CARE  °· Take your antibiotic medicine as told. Finish the medicine even if you start to feel better. °· Keep the infected arm or leg raised (elevated). °· Put a warm cloth on the area up to 4 times per day. °· Only take medicines as told by your doctor. °· Keep all doctor visits as told. °GET HELP IF: °· You see red streaks on the skin coming from the infected area. °· Your red area gets bigger or turns a dark color. °· Your bone or joint under the infected area is painful after the skin heals. °· Your infection comes back in the same area or different area. °· You have a puffy (swollen) bump in the infected area. °· You have new symptoms. °· You have a fever. °GET HELP RIGHT AWAY IF:  °· You feel very sleepy. °· You throw up (vomit) or have watery poop (diarrhea). °· You feel sick and have muscle aches and pains. °  °This information is not intended to replace advice given to you by your health care provider. Make sure you discuss any questions you have with your health care provider. °  °Document Released: 04/14/2008 Document Revised: 07/18/2015 Document Reviewed: 01/12/2012 °Elsevier Interactive Patient Education ©2016 [employer] ° °

## 2016-03-03 ENCOUNTER — Encounter: Payer: Self-pay | Admitting: Internal Medicine

## 2016-03-03 ENCOUNTER — Ambulatory Visit (INDEPENDENT_AMBULATORY_CARE_PROVIDER_SITE_OTHER): Payer: Managed Care, Other (non HMO) | Admitting: Internal Medicine

## 2016-03-03 VITALS — BP 132/82 | HR 68 | Ht 71.5 in | Wt 370.6 lb

## 2016-03-03 DIAGNOSIS — Z6841 Body Mass Index (BMI) 40.0 and over, adult: Secondary | ICD-10-CM | POA: Diagnosis not present

## 2016-03-03 DIAGNOSIS — I10 Essential (primary) hypertension: Secondary | ICD-10-CM | POA: Diagnosis not present

## 2016-03-03 DIAGNOSIS — D696 Thrombocytopenia, unspecified: Secondary | ICD-10-CM

## 2016-03-03 MED ORDER — AMLODIPINE BESYLATE 2.5 MG PO TABS: 2.5000 mg | ORAL_TABLET | Freq: Every day | ORAL | Status: DC

## 2016-03-03 NOTE — Progress Notes (Signed)
Date:  03/03/2016   Name:  Matthew Schaefer.   DOB:  August 20, 1961   MRN:  [id number]   Chief Complaint: Follow-up and Hypertension Hypertension This is a chronic problem. The current episode started more than 1 year ago. Pertinent negatives include no chest pain, headaches, palpitations or shortness of breath. Past treatments include calcium channel blockers. The current treatment provides significant improvement. There are no compliance problems (he takes medication every other day).    Obesity - he continue exercise regularly - both cardio and weights. He believes that his diet is basically healthy.  He feels well but can not lose any weight.  He is not interested in surgery.  We discussed that medication is only indicated short term and is not very effective.  Review of Systems  Constitutional: Negative for fever, chills and fatigue.  Respiratory: Negative for cough, chest tightness and shortness of breath.   Cardiovascular: Negative for chest pain, palpitations and leg swelling.  Gastrointestinal: Negative for abdominal pain.  Musculoskeletal: Positive for back pain. Negative for myalgias and neck stiffness.  Skin: Negative for color change and rash.  Neurological: Negative for dizziness and headaches.  Hematological: Does not bruise/bleed easily.  Psychiatric/Behavioral: Negative for sleep disturbance and dysphoric mood.    Patient Active Problem List   Diagnosis Date Noted  . Essential hypertension 05/01/2015  . Environmental and seasonal allergies 05/01/2015  . GERD without esophagitis 05/01/2015  . Osteoarthritis of spine 05/01/2015  . Thrombocytopenia (HCC) 05/01/2015    Prior to Admission medications   Medication Sig Start Date End Date Taking? Authorizing Provider  amLODipine (NORVASC) 2.5 MG tablet Take 1 tablet (2.5 mg total) by mouth daily. 05/01/15  Yes Reubin Milan, MD  fluticasone (FLONASE) 50 MCG/ACT nasal spray Place 2 sprays into both nostrils daily. 05/01/15   Yes Reubin Milan, MD  gabapentin (NEURONTIN) 100 MG capsule Take 1 capsule (100 mg total) by mouth at bedtime. 05/01/15  Yes Reubin Milan, MD  Multiple Vitamin (MULTIVITAMIN) tablet Take 1 tablet by mouth daily.   Yes Historical Provider, MD    Allergies  Allergen Reactions  . Augmentin [Amoxicillin-Pot Clavulanate] Palpitations    Past Surgical History  Procedure Laterality Date  . Replacement total knee bilateral Bilateral 2009  . Esophagogastroduodenoscopy  2013    HH, gastritis, H Pylori (-)  . Colonoscopy  2011    normal    Social History  Substance Use Topics  . Smoking status: Never Smoker   . Smokeless tobacco: Current User    Types: Snuff  . Alcohol Use: No    Medication list has been reviewed and updated.   Physical Exam  Constitutional: He is oriented to person, place, and time. He appears well-developed and well-nourished.  Neck: Normal range of motion. Neck supple.  Cardiovascular: Normal rate, regular rhythm and normal heart sounds.   Pulmonary/Chest: Effort normal and breath sounds normal.  Abdominal: Soft. Bowel sounds are normal.  Musculoskeletal: He exhibits no edema or tenderness.  Neurological: He is alert and oriented to person, place, and time. He has normal reflexes.  Skin: Skin is warm and dry. No rash noted. No erythema.  Psychiatric: He has a normal mood and affect.  Nursing note and vitals reviewed.  Wt Readings from Last 3 Encounters:  03/03/16 370 lb 9.6 oz (168.103 kg)  10/31/15 355 lb (161.027 kg)  09/03/15 365 lb 6.4 oz (165.744 kg)   Lab Results  Component Value Date   WBC 3.4 09/03/2015  HGB 15.8 09/14/2014   HCT 44.9 09/03/2015   MCV 85 09/03/2015   PLT 156 09/03/2015    BP 124/82 mmHg  Pulse 68  Ht 5' 11.5" (1.816 m)  Wt 370 lb 9.6 oz (168.103 kg)  BMI 50.97 kg/m2  Assessment and Plan: 1. Essential hypertension Controlled on low dose amlodipine - continue current therapy - amLODipine (NORVASC) 2.5 MG tablet;  Take 1 tablet (2.5 mg total) by mouth daily.  Dispense: 30 tablet; Refill: 12  2. Thrombocytopenia (HCC) Stable ~ 150K  3. BMI 50.0-59.9, adult (HCC) Continue exercise - work harder at diet   Bari EdwardLaura Shristi Scheib, MD [facility]  03/03/2016

## 2016-09-08 ENCOUNTER — Ambulatory Visit (INDEPENDENT_AMBULATORY_CARE_PROVIDER_SITE_OTHER): Payer: Managed Care, Other (non HMO) | Admitting: Internal Medicine

## 2016-09-08 ENCOUNTER — Encounter: Payer: Self-pay | Admitting: Internal Medicine

## 2016-09-08 VITALS — BP 127/80 | HR 69 | Resp 16 | Ht 71.0 in | Wt 354.0 lb

## 2016-09-08 DIAGNOSIS — J3089 Other allergic rhinitis: Secondary | ICD-10-CM | POA: Diagnosis not present

## 2016-09-08 DIAGNOSIS — Z0000 Encounter for general adult medical examination without abnormal findings: Secondary | ICD-10-CM | POA: Diagnosis not present

## 2016-09-08 DIAGNOSIS — M47894 Other spondylosis, thoracic region: Secondary | ICD-10-CM

## 2016-09-08 DIAGNOSIS — I10 Essential (primary) hypertension: Secondary | ICD-10-CM

## 2016-09-08 DIAGNOSIS — Z1159 Encounter for screening for other viral diseases: Secondary | ICD-10-CM | POA: Diagnosis not present

## 2016-09-08 DIAGNOSIS — Z125 Encounter for screening for malignant neoplasm of prostate: Secondary | ICD-10-CM | POA: Diagnosis not present

## 2016-09-08 DIAGNOSIS — R29898 Other symptoms and signs involving the musculoskeletal system: Secondary | ICD-10-CM

## 2016-09-08 LAB — POCT URINALYSIS DIPSTICK
Bilirubin, UA: NEGATIVE
Blood, UA: NEGATIVE
Glucose, UA: NEGATIVE
KETONES UA: NEGATIVE
LEUKOCYTES UA: NEGATIVE
NITRITE UA: NEGATIVE
PROTEIN UA: NEGATIVE
Spec Grav, UA: 1.02
UROBILINOGEN UA: 0.2
pH, UA: 6

## 2016-09-08 NOTE — Progress Notes (Signed)
Date:  09/08/2016   Name:  Matthew NakayamaFrank Delbuono Jr.   DOB:  1961/05/10   MRN:  [id number]   Chief Complaint: Annual Exam Matthew NakayamaFrank Somarriba Jr. is a 55 y.o. male who presents today for his Complete Annual Exam. He feels well. He reports exercising regularly. He reports he is sleeping well. He is on a paleo-low carb diet and has lost 20 lbs int he past 6 weeks.  Hypertension  This is a chronic problem. The current episode started more than 1 year ago. The problem is controlled. Pertinent negatives include no chest pain, headaches, palpitations or shortness of breath. Past treatments include calcium channel blockers. The current treatment provides significant improvement.   Wt Readings from Last 3 Encounters:  09/08/16 (!) 354 lb (160.6 kg)  03/03/16 (!) 370 lb 9.6 oz (168.1 kg)  10/31/15 (!) 355 lb (161 kg)      Review of Systems  Constitutional: Negative for appetite change, chills, diaphoresis, fatigue and unexpected weight change.  HENT: Positive for mouth sores (rough area inner lower lip). Negative for hearing loss, tinnitus, trouble swallowing and voice change.   Eyes: Negative for visual disturbance.  Respiratory: Negative for choking, shortness of breath and wheezing.   Cardiovascular: Negative for chest pain, palpitations and leg swelling.  Gastrointestinal: Negative for abdominal pain, blood in stool, constipation and diarrhea.  Genitourinary: Negative for difficulty urinating, dysuria and frequency.  Musculoskeletal: Positive for back pain. Negative for arthralgias and myalgias.  Skin: Negative for color change and rash.  Neurological: Negative for dizziness, syncope and headaches.  Hematological: Negative for adenopathy.  Psychiatric/Behavioral: Negative for dysphoric mood and sleep disturbance.    Patient Active Problem List   Diagnosis Date Noted  . BMI 50.0-59.9, adult (HCC) 03/03/2016  . Essential hypertension 05/01/2015  . Environmental and seasonal allergies 05/01/2015    . GERD without esophagitis 05/01/2015  . Osteoarthritis of spine 05/01/2015  . Thrombocytopenia (HCC) 05/01/2015    Prior to Admission medications   Medication Sig Start Date End Date Taking? Authorizing Provider  amLODipine (NORVASC) 2.5 MG tablet Take 1 tablet (2.5 mg total) by mouth daily. 03/03/16  Yes Reubin MilanLaura H De Libman, MD  fluticasone (FLONASE) 50 MCG/ACT nasal spray Place 2 sprays into both nostrils daily. 05/01/15  Yes Reubin MilanLaura H Audrey Eller, MD  gabapentin (NEURONTIN) 100 MG capsule Take 1 capsule (100 mg total) by mouth at bedtime. 05/01/15  Yes Reubin MilanLaura H Tarence Searcy, MD  Multiple Vitamin (MULTIVITAMIN) tablet Take 1 tablet by mouth daily.   Yes Historical Provider, MD    Allergies  Allergen Reactions  . Augmentin [Amoxicillin-Pot Clavulanate] Palpitations    Past Surgical History:  Procedure Laterality Date  . COLONOSCOPY  2011   normal  . ESOPHAGOGASTRODUODENOSCOPY  2013   HH, gastritis, H Pylori (-)  . REPLACEMENT TOTAL KNEE BILATERAL Bilateral 2009    Social History  Substance Use Topics  . Smoking status: Never Smoker  . Smokeless tobacco: Current User    Types: Snuff  . Alcohol use No     Medication list has been reviewed and updated.   Physical Exam  Constitutional: He is oriented to person, place, and time. He appears well-developed and well-nourished.  HENT:  Head: Normocephalic.  Right Ear: Tympanic membrane, external ear and ear canal normal.  Left Ear: Tympanic membrane, external ear and ear canal normal.  Nose: Nose normal.  Mouth/Throat: Uvula is midline and oropharynx is clear and moist.  Inner lower lip - irregular area of paler mucosa - no ulceration  or tenderness  Eyes: Conjunctivae and EOM are normal. Pupils are equal, round, and reactive to light.  Neck: Normal range of motion. Neck supple. Carotid bruit is not present. No thyromegaly present.  Cardiovascular: Normal rate, regular rhythm, normal heart sounds and intact distal pulses.    Pulmonary/Chest: Effort normal and breath sounds normal. He has no wheezes. Right breast exhibits no mass. Left breast exhibits no mass.  Abdominal: Soft. Bowel sounds are normal. There is no tenderness.  Obese - difficult to assess liver and spleen  Musculoskeletal: Normal range of motion.  Decreased extension of both elbows Pronation limited just short of 90 deg bilaterally  Lymphadenopathy:    He has no cervical adenopathy.  Neurological: He is alert and oriented to person, place, and time. He has normal reflexes.  Skin: Skin is warm, dry and intact.  Psychiatric: He has a normal mood and affect. His speech is normal and behavior is normal. Judgment and thought content normal.  Nursing note and vitals reviewed.   BP 127/80   Pulse 69   Resp 16   Ht 5\' 11"  (1.803 m)   Wt (!) 354 lb (160.6 kg)   SpO2 98%   BMI 49.37 kg/m   Assessment and Plan: 1. Annual physical exam Continue diet and exercise for weight loss Monitor oral lesion for pregression - may need Oral surgeon to evaluate - POCT urinalysis dipstick  2. Essential hypertension controlled - CBC with Differential/Platelet - Comprehensive metabolic panel - TSH  3. Other osteoarthritis of spine, thoracic region stable  4. Environmental and seasonal allergies stable  5. Prostate cancer screening DRE deferred - PSA  6. Need for hepatitis C screening test - Hepatitis C antibody  7. Elbow problem - Ambulatory referral to Orthopedic Surgery   Bari EdwardLaura Sarah Baez, MD [facility]  09/08/2016

## 2016-09-09 LAB — COMPREHENSIVE METABOLIC PANEL
A/G RATIO: 1.5 (ref 1.2–2.2)
ALBUMIN: 4.1 g/dL (ref 3.5–5.5)
ALK PHOS: 77 IU/L (ref 39–117)
ALT: 20 IU/L (ref 0–44)
AST: 19 IU/L (ref 0–40)
BUN / CREAT RATIO: 18 (ref 9–20)
BUN: 17 mg/dL (ref 6–24)
Bilirubin Total: 0.7 mg/dL (ref 0.0–1.2)
CO2: 25 mmol/L (ref 18–29)
CREATININE: 0.97 mg/dL (ref 0.76–1.27)
Calcium: 9.2 mg/dL (ref 8.7–10.2)
Chloride: 102 mmol/L (ref 96–106)
GFR calc Af Amer: 100 mL/min/{1.73_m2} (ref >59)
GFR calc non Af Amer: 86 mL/min/{1.73_m2} (ref >59)
GLOBULIN, TOTAL: 2.7 g/dL (ref 1.5–4.5)
Glucose: 97 mg/dL (ref 65–99)
POTASSIUM: 4.5 mmol/L (ref 3.5–5.2)
SODIUM: 141 mmol/L (ref 134–144)
Total Protein: 6.8 g/dL (ref 6.0–8.5)

## 2016-09-09 LAB — PSA: PROSTATE SPECIFIC AG, SERUM: 0.3 ng/mL (ref 0.0–4.0)

## 2016-09-09 LAB — CBC WITH DIFFERENTIAL/PLATELET
BASOS: 1 %
Basophils Absolute: 0 10*3/uL (ref 0.0–0.2)
EOS (ABSOLUTE): 0.1 10*3/uL (ref 0.0–0.4)
EOS: 3 %
HEMATOCRIT: 47.9 % (ref 37.5–51.0)
HEMOGLOBIN: 15.9 g/dL (ref 12.6–17.7)
Immature Grans (Abs): 0 10*3/uL (ref 0.0–0.1)
Immature Granulocytes: 0 %
LYMPHS ABS: 1.1 10*3/uL (ref 0.7–3.1)
Lymphs: 36 %
MCH: 29 pg (ref 26.6–33.0)
MCHC: 33.2 g/dL (ref 31.5–35.7)
MCV: 87 fL (ref 79–97)
MONOCYTES: 16 %
MONOS ABS: 0.5 10*3/uL (ref 0.1–0.9)
NEUTROS ABS: 1.4 10*3/uL (ref 1.4–7.0)
Neutrophils: 44 %
Platelets: 155 10*3/uL (ref 150–379)
RBC: 5.48 x10E6/uL (ref 4.14–5.80)
RDW: 14 % (ref 12.3–15.4)
WBC: 3 10*3/uL — AB (ref 3.4–10.8)

## 2016-09-09 LAB — TSH: TSH: 1.5 u[IU]/mL (ref 0.450–4.500)

## 2016-09-09 LAB — HEPATITIS C ANTIBODY

## 2017-03-09 ENCOUNTER — Encounter: Payer: Self-pay | Admitting: Internal Medicine

## 2017-03-09 ENCOUNTER — Ambulatory Visit (INDEPENDENT_AMBULATORY_CARE_PROVIDER_SITE_OTHER): Payer: Managed Care, Other (non HMO) | Admitting: Internal Medicine

## 2017-03-09 VITALS — BP 124/70 | HR 64 | Ht 71.0 in | Wt 343.4 lb

## 2017-03-09 DIAGNOSIS — Z6841 Body Mass Index (BMI) 40.0 and over, adult: Secondary | ICD-10-CM | POA: Diagnosis not present

## 2017-03-09 DIAGNOSIS — I10 Essential (primary) hypertension: Secondary | ICD-10-CM

## 2017-03-09 NOTE — Progress Notes (Signed)
Date:  03/09/2017   Name:  Matthew Schaefer.   DOB:  09/22/1961   MRN:  [id number]   Chief Complaint: Hypertension Hypertension  This is a chronic problem. The problem is unchanged. The problem is controlled. Pertinent negatives include no chest pain, headaches, palpitations or shortness of breath. There are no associated agents to hypertension. Past treatments include calcium channel blockers (not currently taking medication). The current treatment provides significant improvement. There is no history of kidney disease or CAD/MI.  He reports feeling very well.  He is on a modified Keto diet and has lost 10 lbs. He continues to exercise daily. 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 No treatment recommended. Symptoms are mild and intermittent.   Review of Systems  Constitutional: Negative for appetite change, fatigue and unexpected weight change.  Eyes: Negative for visual disturbance.  Respiratory: Negative for cough, shortness of breath and wheezing.   Cardiovascular: Negative for chest pain, palpitations and leg swelling.  Gastrointestinal: Negative for abdominal pain and blood in stool.  Genitourinary: Negative for dysuria and hematuria.  Musculoskeletal: Positive for arthralgias (right elbow stiffness intermittently).  Skin: Negative for color change and rash.  Neurological: Negative for tremors, numbness and headaches.  Psychiatric/Behavioral: Negative for dysphoric mood.    Patient Active Problem List   Diagnosis Date Noted  . Elbow problem 09/08/2016  . BMI 50.0-59.9, adult (HCC) 03/03/2016  . Essential hypertension 05/01/2015  . Environmental and seasonal allergies 05/01/2015  . GERD without esophagitis 05/01/2015  . Osteoarthritis of spine 05/01/2015  . Thrombocytopenia (HCC) 05/01/2015    Prior to Admission medications   Medication Sig Start Date End Date Taking? Authorizing Provider  amLODipine (NORVASC) 2.5 MG tablet Take 1 tablet (2.5 mg total) by mouth daily. 03/03/16  Yes  Reubin Milan, MD  fluticasone (FLONASE) 50 MCG/ACT nasal spray Place 2 sprays into both nostrils daily. 05/01/15  Yes Reubin Milan, MD  gabapentin (NEURONTIN) 100 MG capsule Take 1 capsule (100 mg total) by mouth at bedtime. 05/01/15  Yes Reubin Milan, MD  Multiple Vitamin (MULTIVITAMIN) tablet Take 1 tablet by mouth daily.   Yes Historical Provider, MD    Allergies  Allergen Reactions  . Augmentin [Amoxicillin-Pot Clavulanate] Palpitations    Past Surgical History:  Procedure Laterality Date  . COLONOSCOPY  2011   normal  . ESOPHAGOGASTRODUODENOSCOPY  2013   HH, gastritis, H Pylori (-)  . REPLACEMENT TOTAL KNEE BILATERAL Bilateral 2009    Social History  Substance Use Topics  . Smoking status: Never Smoker  . Smokeless tobacco: Current User    Types: Snuff  . Alcohol use No     Medication list has been reviewed and updated.   Physical Exam  Constitutional: He is oriented to person, place, and time. He appears well-developed. No distress.  HENT:  Head: Normocephalic and atraumatic.  Neck: Normal range of motion. Neck supple.  Pulmonary/Chest: Effort normal. No respiratory distress.  Musculoskeletal: Normal range of motion. He exhibits edema (trace ankle edema).  Neurological: He is alert and oriented to person, place, and time.  Skin: Skin is warm and dry. No rash noted.  Psychiatric: He has a normal mood and affect. His behavior is normal. Thought content normal.  Nursing note and vitals reviewed.   BP 124/70 (BP Location: Right Arm, Patient Position: Sitting, Cuff Size: Large)   Pulse 64   Ht  (1.803 m)   Wt (!) 343 lb 6.4 oz (155.8 kg)   SpO2 97%  BMI 47.89 kg/m   Assessment and Plan: 1. Essential hypertension Controlled without medication  2. BMI 50.0-59.9, adult (HCC) Continue diet and exercise   No orders of the defined types were placed in this encounter.   Bari Edward, MD [facility]  03/09/2017

## 2017-09-14 ENCOUNTER — Encounter: Payer: Self-pay | Admitting: Internal Medicine

## 2017-09-14 ENCOUNTER — Ambulatory Visit (INDEPENDENT_AMBULATORY_CARE_PROVIDER_SITE_OTHER): Payer: Managed Care, Other (non HMO) | Admitting: Internal Medicine

## 2017-09-14 VITALS — BP 128/78 | HR 60 | Ht 71.0 in | Wt 339.0 lb

## 2017-09-14 DIAGNOSIS — D696 Thrombocytopenia, unspecified: Secondary | ICD-10-CM

## 2017-09-14 DIAGNOSIS — I10 Essential (primary) hypertension: Secondary | ICD-10-CM | POA: Diagnosis not present

## 2017-09-14 DIAGNOSIS — Z125 Encounter for screening for malignant neoplasm of prostate: Secondary | ICD-10-CM | POA: Diagnosis not present

## 2017-09-14 DIAGNOSIS — J3089 Other allergic rhinitis: Secondary | ICD-10-CM

## 2017-09-14 DIAGNOSIS — Z0000 Encounter for general adult medical examination without abnormal findings: Secondary | ICD-10-CM | POA: Diagnosis not present

## 2017-09-14 DIAGNOSIS — K219 Gastro-esophageal reflux disease without esophagitis: Secondary | ICD-10-CM

## 2017-09-14 LAB — POCT URINALYSIS DIPSTICK
Bilirubin, UA: NEGATIVE
Blood, UA: NEGATIVE
GLUCOSE UA: NEGATIVE
Ketones, UA: NEGATIVE
LEUKOCYTES UA: NEGATIVE
NITRITE UA: NEGATIVE
PROTEIN UA: NEGATIVE
SPEC GRAV UA: 1.015 (ref 1.010–1.025)
UROBILINOGEN UA: 0.2 U/dL
pH, UA: 6 (ref 5.0–8.0)

## 2017-09-14 NOTE — Progress Notes (Signed)
Date:  09/14/2017   Name:  Matthew NakayamaFrank Corne Jr.   DOB:  [birth year]   MRN:  [id number]   Chief Complaint: Annual Exam; Hypertension; and Gastroesophageal Reflux Matthew NakayamaFrank Bowditch Jr. is a 56 y.o. male who presents today for his Complete Annual Exam. He feels well. He reports exercising daily cardio and weights. He reports he is sleeping well.   Hypertension  The problem has been resolved since onset. The problem is controlled. Pertinent negatives include no chest pain, headaches, palpitations or shortness of breath. Past treatments include lifestyle changes.  Gastroesophageal Reflux  He reports no abdominal pain, no chest pain, no choking or no wheezing. Pertinent negatives include no fatigue.  Back Pain  This is a chronic problem. The problem occurs intermittently. The problem has been waxing and waning since onset. The pain is present in the lumbar spine. The quality of the pain is described as shooting. Pertinent negatives include no abdominal pain, chest pain, dysuria or headaches. He has tried analgesics for the symptoms.      Review of Systems  Constitutional: Negative for appetite change, chills, diaphoresis, fatigue and unexpected weight change.  HENT: Negative for hearing loss, tinnitus, trouble swallowing and voice change.   Eyes: Negative for visual disturbance.  Respiratory: Negative for choking, shortness of breath and wheezing.   Cardiovascular: Negative for chest pain, palpitations and leg swelling.  Gastrointestinal: Negative for abdominal pain, blood in stool, constipation and diarrhea.  Genitourinary: Negative for difficulty urinating, dysuria and frequency.  Musculoskeletal: Positive for back pain (intermittent). Negative for arthralgias and myalgias.  Skin: Negative for color change and rash.  Allergic/Immunologic: Positive for environmental allergies.  Neurological: Negative for dizziness, syncope and headaches.  Hematological: Negative for adenopathy.    Psychiatric/Behavioral: Negative for dysphoric mood and sleep disturbance.    Patient Active Problem List   Diagnosis Date Noted  . Elbow problem 09/08/2016  . BMI 50.0-59.9, adult (HCC) 03/03/2016  . Essential hypertension 05/01/2015  . Environmental and seasonal allergies 05/01/2015  . GERD without esophagitis 05/01/2015  . Osteoarthritis of spine 05/01/2015    Prior to Admission medications   Medication Sig Start Date End Date Taking? Authorizing Provider  fluticasone (FLONASE) 50 MCG/ACT nasal spray Place 2 sprays into both nostrils daily. 05/01/15  Yes Reubin MilanBerglund, Kinsly Hild H, MD  gabapentin (NEURONTIN) 100 MG capsule Take 1 capsule (100 mg total) by mouth at bedtime. 05/01/15  Yes Reubin MilanBerglund, Niccolas Loeper H, MD  Multiple Vitamin (MULTIVITAMIN) tablet Take 1 tablet by mouth daily.   Yes [provider]    Allergies  Allergen Reactions  . Augmentin [Amoxicillin-Pot Clavulanate] Palpitations    Past Surgical History:  Procedure Laterality Date  . COLONOSCOPY  2011   normal  . ESOPHAGOGASTRODUODENOSCOPY  2013   HH, gastritis, H Pylori (-)  . REPLACEMENT TOTAL KNEE BILATERAL Bilateral 2009    Social History   Tobacco Use  . Smoking status: Never Smoker  . Smokeless tobacco: Current User    Types: Snuff  Substance Use Topics  . Alcohol use: No    Alcohol/week: 0.0 oz  . Drug use: No     Medication list has been reviewed and updated.  PHQ 2/9 Scores 09/14/2017 09/08/2016  PHQ - 2 Score 0 0    Physical Exam  Constitutional: He is oriented to person, place, and time. He appears well-developed and well-nourished.  HENT:  Head: Normocephalic.  Right Ear: Tympanic membrane, external ear and ear canal normal.  Left Ear: Tympanic membrane, external ear and ear  canal normal.  Nose: Nose normal.  Mouth/Throat: Uvula is midline and oropharynx is clear and moist.  Eyes: Conjunctivae and EOM are normal. Pupils are equal, round, and reactive to light.  Neck: Normal range of  motion. Neck supple. Carotid bruit is not present. No thyromegaly present.  Cardiovascular: Normal rate, regular rhythm, normal heart sounds and intact distal pulses.  Pulmonary/Chest: Effort normal and breath sounds normal. He has no wheezes. Right breast exhibits no mass. Left breast exhibits no mass.  Abdominal: Soft. Normal appearance and bowel sounds are normal. There is no hepatosplenomegaly. There is no tenderness.  Musculoskeletal: Normal range of motion.  Lymphadenopathy:    He has no cervical adenopathy.  Neurological: He is alert and oriented to person, place, and time. He has normal reflexes.  Skin: Skin is warm, dry and intact.  Psychiatric: He has a normal mood and affect. His speech is normal and behavior is normal. Judgment and thought content normal.  Nursing note and vitals reviewed.   BP 128/78   Pulse 60   Ht 5\' 11"  (1.803 m)   Wt (!) 339 lb (153.8 kg)   SpO2 97%   BMI 47.28 kg/m   Assessment and Plan: 1. Annual physical exam Continue diet and exercise - Lipid panel - POCT urinalysis dipstick  2. Prostate cancer screening DRE deferred - PSA  3. Essential hypertension Continue diet and exercise - CBC with Differential/Platelet - Comprehensive metabolic panel  4. GERD without esophagitis intermittent  5. Environmental and seasonal allergies Continue Flonase PRN  6. Thrombocytopenia (HCC) Resolved Last 3 years have been normal   No orders of the defined types were placed in this encounter.   Partially dictated using Animal nutritionist. Any errors are unintentional.  Bari Edward, MD [facility]  09/14/2017

## 2017-09-15 LAB — CBC WITH DIFFERENTIAL/PLATELET
BASOS ABS: 0 10*3/uL (ref 0.0–0.2)
BASOS: 1 %
EOS (ABSOLUTE): 0.1 10*3/uL (ref 0.0–0.4)
Eos: 3 %
HEMATOCRIT: 43.2 % (ref 37.5–51.0)
Hemoglobin: 14.5 g/dL (ref 13.0–17.7)
Immature Grans (Abs): 0 10*3/uL (ref 0.0–0.1)
Immature Granulocytes: 0 %
Lymphocytes Absolute: 1.8 10*3/uL (ref 0.7–3.1)
Lymphs: 55 %
MCH: 28.7 pg (ref 26.6–33.0)
MCHC: 33.6 g/dL (ref 31.5–35.7)
MCV: 85 fL (ref 79–97)
MONOS ABS: 0.4 10*3/uL (ref 0.1–0.9)
Monocytes: 13 %
NEUTROS ABS: 0.9 10*3/uL — AB (ref 1.4–7.0)
Neutrophils: 28 %
PLATELETS: 152 10*3/uL (ref 150–379)
RBC: 5.06 x10E6/uL (ref 4.14–5.80)
RDW: 14.4 % (ref 12.3–15.4)
WBC: 3.2 10*3/uL — ABNORMAL LOW (ref 3.4–10.8)

## 2017-09-15 LAB — COMPREHENSIVE METABOLIC PANEL
ALK PHOS: 76 IU/L (ref 39–117)
ALT: 17 IU/L (ref 0–44)
AST: 22 IU/L (ref 0–40)
Albumin/Globulin Ratio: 1.4 (ref 1.2–2.2)
Albumin: 3.8 g/dL (ref 3.5–5.5)
BILIRUBIN TOTAL: 0.6 mg/dL (ref 0.0–1.2)
BUN/Creatinine Ratio: 14 (ref 9–20)
BUN: 14 mg/dL (ref 6–24)
CHLORIDE: 103 mmol/L (ref 96–106)
CO2: 25 mmol/L (ref 20–29)
Calcium: 8.8 mg/dL (ref 8.7–10.2)
Creatinine, Ser: 1.02 mg/dL (ref 0.76–1.27)
GFR calc Af Amer: 93 mL/min/{1.73_m2} (ref >59)
GFR, EST NON AFRICAN AMERICAN: 81 mL/min/{1.73_m2} (ref >59)
GLOBULIN, TOTAL: 2.8 g/dL (ref 1.5–4.5)
Glucose: 94 mg/dL (ref 65–99)
Potassium: 4.6 mmol/L (ref 3.5–5.2)
SODIUM: 141 mmol/L (ref 134–144)
Total Protein: 6.6 g/dL (ref 6.0–8.5)

## 2017-09-15 LAB — LIPID PANEL
CHOLESTEROL TOTAL: 148 mg/dL (ref 100–199)
Chol/HDL Ratio: 2.3 ratio (ref 0.0–5.0)
HDL: 63 mg/dL (ref >39)
LDL CALC: 76 mg/dL (ref 0–99)
TRIGLYCERIDES: 44 mg/dL (ref 0–149)
VLDL CHOLESTEROL CAL: 9 mg/dL (ref 5–40)

## 2017-09-15 LAB — PSA: Prostate Specific Ag, Serum: 0.3 ng/mL (ref 0.0–4.0)

## 2018-03-15 ENCOUNTER — Encounter: Payer: Self-pay | Admitting: Internal Medicine

## 2018-03-15 ENCOUNTER — Ambulatory Visit: Payer: Managed Care, Other (non HMO) | Admitting: Internal Medicine

## 2018-03-15 VITALS — BP 122/82 | HR 84 | Resp 16 | Ht 71.0 in | Wt 323.0 lb

## 2018-03-15 DIAGNOSIS — Z6841 Body Mass Index (BMI) 40.0 and over, adult: Secondary | ICD-10-CM | POA: Diagnosis not present

## 2018-03-15 DIAGNOSIS — M47894 Other spondylosis, thoracic region: Secondary | ICD-10-CM | POA: Diagnosis not present

## 2018-03-15 DIAGNOSIS — I10 Essential (primary) hypertension: Secondary | ICD-10-CM

## 2018-03-15 NOTE — Progress Notes (Signed)
Date:  03/15/2018   Name:  Matthew Schaefer.   DOB:  1961/10/15   MRN:  [id number]   Chief Complaint: Hypertension Pt here for 6 month follow up of HTN.  Treated in the past but became too low.  Meds stopped and BP is being monitored. He continues on a Keto/paleo diet and exercise and continues to lose weight.  Wt Readings from Last 3 Encounters:  03/15/18 (!) 323 lb (146.5 kg)  09/14/17 (!) 339 lb (153.8 kg)  03/09/17 (!) 343 lb 6.4 oz (155.8 kg)     Review of Systems  Constitutional: Negative for chills, fatigue and fever.  Eyes: Negative for visual disturbance.  Respiratory: Negative for cough, chest tightness, shortness of breath and wheezing.   Cardiovascular: Negative for chest pain and palpitations.  Gastrointestinal: Negative for abdominal pain, constipation and diarrhea.  Musculoskeletal: Positive for back pain (mild, intermittent).  Skin: Negative for color change and rash.  Hematological: Negative for adenopathy.  Psychiatric/Behavioral: Negative for dysphoric mood and sleep disturbance.    Patient Active Problem List   Diagnosis Date Noted  . Elbow problem 09/08/2016  . BMI 50.0-59.9, adult (HCC) 03/03/2016  . Essential hypertension 05/01/2015  . Environmental and seasonal allergies 05/01/2015  . GERD without esophagitis 05/01/2015  . Osteoarthritis of spine 05/01/2015    Prior to Admission medications   Medication Sig Start Date End Date Taking? Authorizing Provider  fluticasone (FLONASE) 50 MCG/ACT nasal spray Place 2 sprays into both nostrils daily. 05/01/15   Reubin Milan, MD       Reubin Milan, MD  Multiple Vitamin (MULTIVITAMIN) tablet Take 1 tablet by mouth daily.    [provider]    Allergies  Allergen Reactions  . Augmentin [Amoxicillin-Pot Clavulanate] Palpitations    Past Surgical History:  Procedure Laterality Date  . COLONOSCOPY  2011   normal  . ESOPHAGOGASTRODUODENOSCOPY  2013   HH, gastritis, H Pylori (-)  .  REPLACEMENT TOTAL KNEE BILATERAL Bilateral 2009    Social History   Tobacco Use  . Smoking status: Never Smoker  . Smokeless tobacco: Current User    Types: Snuff  Substance Use Topics  . Alcohol use: No    Alcohol/week: 0.0 oz  . Drug use: No     Medication list has been reviewed and updated.  PHQ 2/9 Scores 09/14/2017 09/08/2016  PHQ - 2 Score 0 0    Physical Exam  Constitutional: He is oriented to person, place, and time. He appears well-developed. No distress.  HENT:  Head: Normocephalic and atraumatic.  Neck: Normal range of motion. Neck supple.  Cardiovascular: Normal rate, regular rhythm and normal heart sounds.  Pulmonary/Chest: Effort normal and breath sounds normal. No respiratory distress.  Musculoskeletal: Normal range of motion.  Neurological: He is alert and oriented to person, place, and time.  Skin: Skin is warm and dry. No rash noted.  Psychiatric: He has a normal mood and affect. His behavior is normal. Thought content normal.    BP 122/82   Pulse 84   Resp 16   Ht  (1.803 m)   Wt (!) 323 lb (146.5 kg)   SpO2 97%   BMI 45.05 kg/m   Assessment and Plan: 1. Essential hypertension Controlled off of medication with diet and exercise  2. Other osteoarthritis of spine, thoracic region Stable   3. BMI 45.0-49.9, adult [facility]) Doing well with continued slow weight loss   No orders of the defined types were placed in  this encounter.   Partially dictated using Animal nutritionist. Any errors are unintentional.  Bari Edward, MD [facility]  03/15/2018

## 2018-09-20 ENCOUNTER — Encounter: Payer: Self-pay | Admitting: Internal Medicine

## 2018-09-20 ENCOUNTER — Other Ambulatory Visit: Payer: Self-pay

## 2018-09-20 ENCOUNTER — Ambulatory Visit (INDEPENDENT_AMBULATORY_CARE_PROVIDER_SITE_OTHER): Payer: Managed Care, Other (non HMO) | Admitting: Internal Medicine

## 2018-09-20 VITALS — BP 124/80 | HR 80 | Resp 16 | Ht 71.0 in | Wt 314.8 lb

## 2018-09-20 DIAGNOSIS — Z0000 Encounter for general adult medical examination without abnormal findings: Secondary | ICD-10-CM

## 2018-09-20 DIAGNOSIS — M47894 Other spondylosis, thoracic region: Secondary | ICD-10-CM

## 2018-09-20 DIAGNOSIS — Z125 Encounter for screening for malignant neoplasm of prostate: Secondary | ICD-10-CM | POA: Diagnosis not present

## 2018-09-20 DIAGNOSIS — K219 Gastro-esophageal reflux disease without esophagitis: Secondary | ICD-10-CM

## 2018-09-20 DIAGNOSIS — Z6841 Body Mass Index (BMI) 40.0 and over, adult: Secondary | ICD-10-CM | POA: Diagnosis not present

## 2018-09-20 LAB — POCT URINALYSIS DIPSTICK
Bilirubin, UA: NEGATIVE
Blood, UA: NEGATIVE
Glucose, UA: NEGATIVE
KETONES UA: NEGATIVE
Leukocytes, UA: NEGATIVE
NITRITE UA: NEGATIVE
PH UA: 6.5 (ref 5.0–8.0)
PROTEIN UA: NEGATIVE
SPEC GRAV UA: 1.025 (ref 1.010–1.025)
UROBILINOGEN UA: 0.2 U/dL

## 2018-09-20 MED ORDER — GABAPENTIN 100 MG PO CAPS: 100.0000 mg | ORAL_CAPSULE | Freq: Every day | ORAL | 2 refills | Status: DC

## 2018-09-20 NOTE — Progress Notes (Signed)
Date:  09/20/2018   Name:  Matthew Schaefer.   DOB:  September 09, 1961   MRN:  [id number]   Chief Complaint: Annual Exam and Arthritis (Gabapentin refill) Matthew Schaefer. is a 57 y.o. male who presents today for his Complete Annual Exam. He feels well. He reports exercising daily and gradually losing weight. He reports he is sleeping well.   Back Pain  This is a chronic problem. The problem is unchanged. The pain is present in the thoracic spine. The quality of the pain is described as shooting. The pain does not radiate. Pertinent negatives include no abdominal pain, chest pain, dysuria or headaches. Treatments tried: gabapentin. The treatment provided significant relief.  Gastroesophageal Reflux  He complains of heartburn. He reports no abdominal pain, no chest pain, no choking or no wheezing. This is a recurrent problem. The problem occurs rarely. The heartburn is located in the substernum. The heartburn is of mild intensity. The symptoms are aggravated by lying down and certain foods. Pertinent negatives include no fatigue. He has tried an antacid for the symptoms. The treatment provided significant relief.    Review of Systems  Constitutional: Negative for appetite change, chills, diaphoresis, fatigue and unexpected weight change.  HENT: Negative for hearing loss, tinnitus, trouble swallowing and voice change.   Eyes: Negative for visual disturbance.  Respiratory: Negative for choking, shortness of breath and wheezing.   Cardiovascular: Negative for chest pain, palpitations and leg swelling.  Gastrointestinal: Positive for heartburn. Negative for abdominal pain, blood in stool, constipation and diarrhea.  Endocrine: Negative for polydipsia and polyuria.  Genitourinary: Negative for difficulty urinating, dysuria and frequency.  Musculoskeletal: Positive for back pain. Negative for arthralgias and myalgias.  Skin: Negative for color change and rash.  Allergic/Immunologic: Negative for  environmental allergies.  Neurological: Positive for dizziness (ocassional sx c/w motion sickness). Negative for syncope and headaches.  Hematological: Negative for adenopathy.  Psychiatric/Behavioral: Negative for dysphoric mood and sleep disturbance.    Patient Active Problem List   Diagnosis Date Noted  . BMI 50.0-59.9, adult (HCC) 03/03/2016  . Environmental and seasonal allergies 05/01/2015  . GERD without esophagitis 05/01/2015  . Osteoarthritis of spine 05/01/2015    Allergies  Allergen Reactions  . Augmentin [Amoxicillin-Pot Clavulanate] Palpitations    Past Surgical History:  Procedure Laterality Date  . COLONOSCOPY  2011   normal  . ESOPHAGOGASTRODUODENOSCOPY  2013   HH, gastritis, H Pylori (-)  . REPLACEMENT TOTAL KNEE BILATERAL Bilateral 2009    Social History   Tobacco Use  . Smoking status: Never Smoker  . Smokeless tobacco: Current User    Types: Snuff  Substance Use Topics  . Alcohol use: No    Alcohol/week: 0.0 standard drinks  . Drug use: No     Medication list has been reviewed and updated.  Current Meds  Medication Sig  . fluticasone (FLONASE) 50 MCG/ACT nasal spray Place 2 sprays into both nostrils daily.  Marland Kitchen gabapentin (NEURONTIN) 100 MG capsule Take 1 capsule (100 mg total) by mouth at bedtime.  . Multiple Vitamin (MULTIVITAMIN) tablet Take 1 tablet by mouth daily.    PHQ 2/9 Scores 09/20/2018 09/14/2017 09/08/2016  PHQ - 2 Score 0 0 0    Physical Exam  Constitutional: He is oriented to person, place, and time. He appears well-developed and well-nourished.  HENT:  Head: Normocephalic.  Right Ear: Tympanic membrane, external ear and ear canal normal.  Left Ear: Tympanic membrane, external ear and ear canal normal.  Nose: Nose  normal.  Mouth/Throat: Uvula is midline and oropharynx is clear and moist.  Eyes: Pupils are equal, round, and reactive to light. Conjunctivae and EOM are normal.  Neck: Normal range of motion. Neck supple.  Carotid bruit is not present. No thyromegaly present.  Cardiovascular: Normal rate, regular rhythm, normal heart sounds and intact distal pulses.  Pulmonary/Chest: Effort normal and breath sounds normal. He has no wheezes. Right breast exhibits no mass. Left breast exhibits no mass.  Abdominal: Soft. Normal appearance and bowel sounds are normal. There is no hepatosplenomegaly. There is no tenderness.  Musculoskeletal: Normal range of motion.  Lymphadenopathy:    He has no cervical adenopathy.  Neurological: He is alert and oriented to person, place, and time. He has normal reflexes.  Skin: Skin is warm, dry and intact.  Psychiatric: He has a normal mood and affect. His speech is normal and behavior is normal. Judgment and thought content normal.  Nursing note and vitals reviewed.   BP 124/80   Pulse 80   Resp 16   Ht 5\' 11"  (1.803 m)   Wt (!) 314 lb 12.8 oz (142.8 kg)   SpO2 97%   BMI 43.91 kg/m   Assessment and Plan: 1. Annual physical exam Continue regular exercise and diet changes - Comprehensive metabolic panel - Lipid panel - POCT urinalysis dipstick  2. Prostate cancer screening DRE deferred - PSA  3. BMI 40.0-44.9, adult (HCC) Doing very well with diet, exercise and weight loss - Hemoglobin A1c  4. GERD without esophagitis Minimally sx - CBC with Differential/Platelet  5. Other osteoarthritis of spine, thoracic region Continue gabapentin PRN - gabapentin (NEURONTIN) 100 MG capsule; Take 1 capsule (100 mg total) by mouth at bedtime.  Dispense: 30 capsule; Refill: 2   Partially dictated using Animal nutritionist. Any errors are unintentional.  Bari Edward, MD [facility]  09/20/2018

## 2018-09-21 LAB — COMPREHENSIVE METABOLIC PANEL
A/G RATIO: 1.4 (ref 1.2–2.2)
ALK PHOS: 71 IU/L (ref 39–117)
ALT: 16 IU/L (ref 0–44)
AST: 20 IU/L (ref 0–40)
Albumin: 4.1 g/dL (ref 3.5–5.5)
BUN/Creatinine Ratio: 18 (ref 9–20)
BUN: 20 mg/dL (ref 6–24)
Bilirubin Total: 0.7 mg/dL (ref 0.0–1.2)
CO2: 23 mmol/L (ref 20–29)
Calcium: 9.2 mg/dL (ref 8.7–10.2)
Chloride: 103 mmol/L (ref 96–106)
Creatinine, Ser: 1.1 mg/dL (ref 0.76–1.27)
GFR calc Af Amer: 84 mL/min/{1.73_m2} (ref >59)
GFR calc non Af Amer: 73 mL/min/{1.73_m2} (ref >59)
GLUCOSE: 94 mg/dL (ref 65–99)
Globulin, Total: 2.9 g/dL (ref 1.5–4.5)
POTASSIUM: 4.4 mmol/L (ref 3.5–5.2)
SODIUM: 139 mmol/L (ref 134–144)
Total Protein: 7 g/dL (ref 6.0–8.5)

## 2018-09-21 LAB — CBC WITH DIFFERENTIAL/PLATELET
BASOS ABS: 0 10*3/uL (ref 0.0–0.2)
BASOS: 1 %
EOS (ABSOLUTE): 0.1 10*3/uL (ref 0.0–0.4)
Eos: 3 %
Hematocrit: 46.8 % (ref 37.5–51.0)
Hemoglobin: 15.8 g/dL (ref 13.0–17.7)
IMMATURE GRANS (ABS): 0 10*3/uL (ref 0.0–0.1)
IMMATURE GRANULOCYTES: 0 %
LYMPHS: 52 %
Lymphocytes Absolute: 1.5 10*3/uL (ref 0.7–3.1)
MCH: 28.6 pg (ref 26.6–33.0)
MCHC: 33.8 g/dL (ref 31.5–35.7)
MCV: 85 fL (ref 79–97)
MONOS ABS: 0.6 10*3/uL (ref 0.1–0.9)
Monocytes: 20 %
NEUTROS PCT: 24 %
Neutrophils Absolute: 0.7 10*3/uL — ABNORMAL LOW (ref 1.4–7.0)
Platelets: 181 10*3/uL (ref 150–450)
RBC: 5.52 x10E6/uL (ref 4.14–5.80)
RDW: 13.3 % (ref 12.3–15.4)
WBC: 2.9 10*3/uL — AB (ref 3.4–10.8)

## 2018-09-21 LAB — LIPID PANEL
CHOLESTEROL TOTAL: 162 mg/dL (ref 100–199)
Chol/HDL Ratio: 2.4 ratio (ref 0.0–5.0)
HDL: 67 mg/dL (ref >39)
LDL Calculated: 86 mg/dL (ref 0–99)
TRIGLYCERIDES: 46 mg/dL (ref 0–149)
VLDL Cholesterol Cal: 9 mg/dL (ref 5–40)

## 2018-09-21 LAB — PSA: Prostate Specific Ag, Serum: 0.3 ng/mL (ref 0.0–4.0)

## 2018-09-21 LAB — HEMOGLOBIN A1C
ESTIMATED AVERAGE GLUCOSE: 100 mg/dL
Hgb A1c MFr Bld: 5.1 % (ref 4.8–5.6)

## 2019-01-03 ENCOUNTER — Ambulatory Visit: Payer: Managed Care, Other (non HMO) | Admitting: Internal Medicine

## 2019-01-03 ENCOUNTER — Encounter: Payer: Self-pay | Admitting: Internal Medicine

## 2019-01-03 ENCOUNTER — Other Ambulatory Visit: Payer: Self-pay

## 2019-01-03 VITALS — BP 124/70 | HR 60 | Temp 98.3°F | Ht 71.0 in | Wt 317.0 lb

## 2019-01-03 DIAGNOSIS — J0100 Acute maxillary sinusitis, unspecified: Secondary | ICD-10-CM | POA: Diagnosis not present

## 2019-01-03 DIAGNOSIS — H6982 Other specified disorders of Eustachian tube, left ear: Secondary | ICD-10-CM

## 2019-01-03 DIAGNOSIS — Z6841 Body Mass Index (BMI) 40.0 and over, adult: Secondary | ICD-10-CM | POA: Diagnosis not present

## 2019-01-03 MED ORDER — CEFDINIR 300 MG PO CAPS: 300.0000 mg | ORAL_CAPSULE | Freq: Two times a day (BID) | ORAL | 0 refills | Status: AC

## 2019-01-03 NOTE — Patient Instructions (Signed)
Sudafed 30 mg twice a day as needed  Continue Flonase Spray

## 2019-01-03 NOTE — Progress Notes (Signed)
Date:  01/03/2019   Name:  Matthew Schaefer.   DOB:  April 17, 1961   MRN:  [id number]   Chief Complaint: Ear Pain (Left ear feels like drainage and having heat/burning sensation. When walking patient is off balance. )  Dizziness  This is a recurrent problem. The problem occurs rarely. Associated symptoms include congestion and a sore throat. Pertinent negatives include no chest pain, chills, diaphoresis, fatigue, fever, headaches or vertigo. The symptoms are aggravated by twisting. Treatments tried: flonase.  Otalgia   There is pain in the left ear. This is a new problem. The current episode started in the past 7 days. The problem occurs constantly. The problem has been gradually worsening. There has been no fever. The pain is mild. Associated symptoms include a sore throat. Pertinent negatives include no headaches. Associated symptoms comments: Mild sensation of being off balance, no true vertigo.    Review of Systems  Constitutional: Negative for chills, diaphoresis, fatigue and fever.  HENT: Positive for congestion, ear pain, postnasal drip and sore throat. Negative for trouble swallowing.   Respiratory: Negative for chest tightness and shortness of breath.   Cardiovascular: Negative for chest pain and palpitations.  Neurological: Positive for dizziness and light-headedness. Negative for vertigo and headaches.    Patient Active Problem List   Diagnosis Date Noted  . BMI 50.0-59.9, adult (HCC) 03/03/2016  . Environmental and seasonal allergies 05/01/2015  . GERD without esophagitis 05/01/2015  . Osteoarthritis of spine 05/01/2015    Allergies  Allergen Reactions  . Augmentin [Amoxicillin-Pot Clavulanate] Palpitations    Past Surgical History:  Procedure Laterality Date  . COLONOSCOPY  2011   normal  . ESOPHAGOGASTRODUODENOSCOPY  2013   HH, gastritis, H Pylori (-)  . REPLACEMENT TOTAL KNEE BILATERAL Bilateral 2009    Social History   Tobacco Use  . Smoking status: Never  Smoker  . Smokeless tobacco: Current User    Types: Snuff  Substance Use Topics  . Alcohol use: No    Alcohol/week: 0.0 standard drinks  . Drug use: No     Medication list has been reviewed and updated.  Current Meds  Medication Sig  . fluticasone (FLONASE) 50 MCG/ACT nasal spray Place 2 sprays into both nostrils daily.  Marland Kitchen gabapentin (NEURONTIN) 100 MG capsule Take 1 capsule (100 mg total) by mouth at bedtime.  . Multiple Vitamin (MULTIVITAMIN) tablet Take 1 tablet by mouth daily.    PHQ 2/9 Scores 01/03/2019 09/20/2018 09/14/2017 09/08/2016  PHQ - 2 Score 0 0 0 0   Wt Readings from Last 3 Encounters:  01/03/19 (!) 317 lb (143.8 kg)  09/20/18 (!) 314 lb 12.8 oz (142.8 kg)  03/15/18 (!) 323 lb (146.5 kg)    Physical Exam Constitutional:      Appearance: He is well-developed.  HENT:     Right Ear: Ear canal and external ear normal. Tympanic membrane is not erythematous or retracted.     Left Ear: Ear canal and external ear normal. Tympanic membrane is erythematous and retracted.     Nose:     Right Sinus: Maxillary sinus tenderness present. No frontal sinus tenderness.     Left Sinus: Maxillary sinus tenderness present. No frontal sinus tenderness.     Mouth/Throat:     Mouth: No oral lesions.     Pharynx: Uvula midline. Posterior oropharyngeal erythema present. No oropharyngeal exudate.  Neck:     Musculoskeletal: Normal range of motion and neck supple.  Cardiovascular:     Rate and  Rhythm: Normal rate and regular rhythm.     Heart sounds: Normal heart sounds.  Pulmonary:     Breath sounds: Normal breath sounds. No wheezing or rales.  Lymphadenopathy:     Cervical: No cervical adenopathy.  Neurological:     Mental Status: He is alert and oriented to person, place, and time.     BP 124/70   Pulse 60   Temp 98.3 F (36.8 C) (Oral)   Ht 5\' 11"  (1.803 m)   Wt (!) 317 lb (143.8 kg)   SpO2 97%   BMI 44.21 kg/m   Assessment and Plan: 1. 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  Dispense: 20 capsule; Refill: 0  2. Eustachian tube dysfunction, left Use Flonase and Sudafed  3. BMI 40.0-44.9, adult (HCC) Continue to work on diet and exercise   Partially dictated using Animal nutritionist. Any errors are unintentional.  Bari Edward, MD [facility]  01/03/2019

## 2019-01-31 ENCOUNTER — Other Ambulatory Visit: Payer: Self-pay

## 2019-01-31 ENCOUNTER — Ambulatory Visit (INDEPENDENT_AMBULATORY_CARE_PROVIDER_SITE_OTHER): Payer: Managed Care, Other (non HMO) | Admitting: Internal Medicine

## 2019-01-31 ENCOUNTER — Encounter: Payer: Self-pay | Admitting: Internal Medicine

## 2019-01-31 ENCOUNTER — Ambulatory Visit: Payer: Self-pay | Admitting: Internal Medicine

## 2019-01-31 VITALS — BP 118/80 | HR 63 | Temp 98.2°F | Ht 71.0 in | Wt 313.0 lb

## 2019-01-31 DIAGNOSIS — H6690 Otitis media, unspecified, unspecified ear: Secondary | ICD-10-CM | POA: Diagnosis not present

## 2019-01-31 MED ORDER — PREDNISONE 10 MG PO TABS: 10.0000 mg | ORAL_TABLET | ORAL | 0 refills | Status: AC

## 2019-01-31 MED ORDER — AMOXICILLIN 875 MG PO TABS: 875.0000 mg | ORAL_TABLET | Freq: Two times a day (BID) | ORAL | 0 refills | Status: AC

## 2019-01-31 NOTE — Progress Notes (Signed)
Date:  01/31/2019   Name:  Matthew Schaefer.   DOB:  1961/04/27   MRN:  [id number]   Chief Complaint: Ear Pain (Ear pain and drainage. Started last thursday. Had at last OV> Left ear feels hot to touch, painful at a 2. Feels drainage in throat on that side. Feels flush. )  Otalgia   There is pain in the left ear. This is a recurrent problem. The current episode started in the past 7 days. The problem occurs constantly. There has been no fever. The pain is mild. Pertinent negatives include no abdominal pain, coughing, ear discharge, headaches, neck pain, sore throat or vomiting. He has tried NSAIDs (flonase and sudafed) for the symptoms. The treatment provided no relief.    Review of Systems  Constitutional: Negative for chills, diaphoresis and fever.  HENT: Positive for ear pain, postnasal drip and sinus pressure. Negative for ear discharge, sore throat, trouble swallowing and voice change.   Respiratory: Negative for cough, chest tightness, shortness of breath and wheezing.   Cardiovascular: Negative for chest pain.  Gastrointestinal: Negative for abdominal pain and vomiting.  Musculoskeletal: Negative for neck pain.  Neurological: Positive for dizziness (mild intermittent). Negative for headaches.  Hematological: Negative for adenopathy.    Patient Active Problem List   Diagnosis Date Noted  . BMI 50.0-59.9, adult (HCC) 03/03/2016  . Environmental and seasonal allergies 05/01/2015  . GERD without esophagitis 05/01/2015  . Osteoarthritis of spine 05/01/2015    Allergies  Allergen Reactions  . Augmentin [Amoxicillin-Pot Clavulanate] Palpitations    Past Surgical History:  Procedure Laterality Date  . COLONOSCOPY  2011   normal  . ESOPHAGOGASTRODUODENOSCOPY  2013   HH, gastritis, H Pylori (-)  . REPLACEMENT TOTAL KNEE BILATERAL Bilateral 2009    Social History   Tobacco Use  . Smoking status: Never Smoker  . Smokeless tobacco: Current User    Types: Snuff  Substance  Use Topics  . Alcohol use: No    Alcohol/week: 0.0 standard drinks  . Drug use: No     Medication list has been reviewed and updated.  Current Meds  Medication Sig  . fluticasone (FLONASE) 50 MCG/ACT nasal spray Place 2 sprays into both nostrils daily.  Marland Kitchen gabapentin (NEURONTIN) 100 MG capsule Take 1 capsule (100 mg total) by mouth at bedtime.  . Multiple Vitamin (MULTIVITAMIN) tablet Take 1 tablet by mouth daily.    PHQ 2/9 Scores 01/31/2019 01/03/2019 09/20/2018 09/14/2017  PHQ - 2 Score 0 0 0 0    Physical Exam Vitals signs and nursing note reviewed.  Constitutional:      General: He is not in acute distress.    Appearance: He is well-developed.  HENT:     Head: Normocephalic and atraumatic.     Right Ear: Ear canal normal. No tenderness. Tympanic membrane is retracted.     Left Ear: Ear canal normal. No tenderness. Tympanic membrane is retracted.     Nose:     Right Sinus: No maxillary sinus tenderness or frontal sinus tenderness.     Left Sinus: Maxillary sinus tenderness present. No frontal sinus tenderness.  Neck:     Musculoskeletal: Normal range of motion and neck supple.  Cardiovascular:     Rate and Rhythm: Normal rate and regular rhythm.  Pulmonary:     Effort: Pulmonary effort is normal. No respiratory distress.     Breath sounds: Normal breath sounds. No wheezing or rales.  Musculoskeletal: Normal range of motion.  Lymphadenopathy:  Cervical: No cervical adenopathy.  Skin:    General: Skin is warm and dry.     Findings: No rash.  Neurological:     Mental Status: He is alert and oriented to person, place, and time.  Psychiatric:        Behavior: Behavior normal.        Thought Content: Thought content normal.     Wt Readings from Last 3 Encounters:  01/31/19 (!) 313 lb (142 kg)  01/03/19 (!) 317 lb (143.8 kg)  09/20/18 (!) 314 lb 12.8 oz (142.8 kg)    BP 118/80   Pulse 63   Temp 98.2 F (36.8 C) (Oral)   Ht 5\' 11"  (1.803 m)   Wt (!) 313 lb  (142 kg)   SpO2 98%   BMI 43.65 kg/m   Assessment and Plan: 1. Acute otitis media, unspecified otitis media type Continue Flonase and sudafed If sx recur, will need to see ENT - amoxicillin (AMOXIL) 875 MG tablet; Take 1 tablet (875 mg total) by mouth 2 (two) times daily for 14 days.  Dispense: 28 tablet; Refill: 0 - predniSONE (DELTASONE) 10 MG tablet; Take 1 tablet (10 mg total) by mouth as directed for 6 days. Take 6,5,4,3,2,1 then stop  Dispense: 21 tablet; Refill: 0   Partially dictated using Animal nutritionist. Any errors are unintentional.  Bari Edward, MD [facility]  01/31/2019

## 2019-09-26 ENCOUNTER — Other Ambulatory Visit: Payer: Self-pay

## 2019-09-26 ENCOUNTER — Encounter: Payer: Self-pay | Admitting: Internal Medicine

## 2019-09-26 ENCOUNTER — Ambulatory Visit (INDEPENDENT_AMBULATORY_CARE_PROVIDER_SITE_OTHER): Payer: Managed Care, Other (non HMO) | Admitting: Internal Medicine

## 2019-09-26 VITALS — BP 132/82 | HR 73 | Ht 71.0 in | Wt 326.0 lb

## 2019-09-26 DIAGNOSIS — Z0000 Encounter for general adult medical examination without abnormal findings: Secondary | ICD-10-CM | POA: Diagnosis not present

## 2019-09-26 DIAGNOSIS — Z125 Encounter for screening for malignant neoplasm of prostate: Secondary | ICD-10-CM | POA: Diagnosis not present

## 2019-09-26 DIAGNOSIS — K5901 Slow transit constipation: Secondary | ICD-10-CM | POA: Diagnosis not present

## 2019-09-26 LAB — POCT URINALYSIS DIPSTICK
Bilirubin, UA: NEGATIVE
Blood, UA: NEGATIVE
Glucose, UA: NEGATIVE
Ketones, UA: NEGATIVE
Leukocytes, UA: NEGATIVE
Nitrite, UA: NEGATIVE
Protein, UA: NEGATIVE
Spec Grav, UA: 1.02 (ref 1.010–1.025)
Urobilinogen, UA: 0.2 E.U./dL
pH, UA: 5 (ref 5.0–8.0)

## 2019-09-26 NOTE — Progress Notes (Signed)
Date:  09/26/2019   Name:  Matthew Schaefer.   DOB:  1961/01/29   MRN:  [id number]   Chief Complaint: Annual Exam Matthew Schaefer. is a 58 y.o. male who presents today for his Complete Annual Exam. He feels well. He reports exercising rowing machine and elliptical. He reports he is sleeping well.   Colonoscopy  09/2010 Immunizations - influenza due - pt declines Tdap 08/2013  Gastroesophageal Reflux He complains of heartburn. He reports no abdominal pain, no chest pain, no choking or no wheezing. This is a recurrent problem. The problem occurs occasionally. The heartburn is of mild intensity. Pertinent negatives include no fatigue. He has tried a histamine-2 antagonist for the symptoms.  Back Pain This is a recurrent problem. The pain is present in the lumbar spine. Pertinent negatives include no abdominal pain, chest pain, dysuria or headaches. Treatments tried: gabapentin PRN.  Constipation This is a chronic problem. The current episode started more than 1 month ago. The problem has been waxing and waning since onset. His stool frequency is 4 to 5 times per week. The stool is described as pellet like. The patient is not on a high fiber diet. He exercises regularly. 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 Associated symptoms include back pain. Pertinent negatives include no abdominal pain, diarrhea or difficulty urinating. He has tried fiber (but not taking with enough fluids) for the symptoms.    Lab Results  Component Value Date   CREATININE 1.10 09/20/2018   BUN 20 09/20/2018   NA 139 09/20/2018   K 4.4 09/20/2018   CL 103 09/20/2018   CO2 23 09/20/2018   Lab Results  Component Value Date   CHOL 162 09/20/2018   HDL 67 09/20/2018   LDLCALC 86 09/20/2018   TRIG 46 09/20/2018   CHOLHDL 2.4 09/20/2018   Lab Results  Component Value Date   TSH 1.500 09/08/2016   Lab Results  Component Value Date   HGBA1C 5.1 09/20/2018     Review of Systems  Constitutional:  Negative for appetite change, chills, diaphoresis, fatigue and unexpected weight change.  HENT: Negative for hearing loss, tinnitus, trouble swallowing and voice change.   Eyes: Negative for visual disturbance.  Respiratory: Negative for choking, shortness of breath and wheezing.   Cardiovascular: Negative for chest pain, palpitations and leg swelling.  Gastrointestinal: Positive for constipation and heartburn. Negative for abdominal pain, blood in stool and diarrhea.  Genitourinary: Negative for difficulty urinating, dysuria and frequency.  Musculoskeletal: Positive for back pain. Negative for arthralgias and myalgias.  Skin: Negative for color change and rash.  Neurological: Negative for dizziness, syncope and headaches.  Hematological: Negative for adenopathy.  Psychiatric/Behavioral: Negative for dysphoric mood and sleep disturbance.    Patient Active Problem List   Diagnosis Date Noted  . BMI 50.0-59.9, adult (HCC) 03/03/2016  . Environmental and seasonal allergies 05/01/2015  . GERD without esophagitis 05/01/2015  . Osteoarthritis of spine 05/01/2015    Allergies  Allergen Reactions  . Augmentin [Amoxicillin-Pot Clavulanate] Palpitations    Past Surgical History:  Procedure Laterality Date  . COLONOSCOPY  2011   normal  . ESOPHAGOGASTRODUODENOSCOPY  2013   HH, gastritis, H Pylori (-)  . REPLACEMENT TOTAL KNEE BILATERAL Bilateral 2009    Social History   Tobacco Use  . Smoking status: Never Smoker  . Smokeless tobacco: Current User    Types: Snuff  Substance Use Topics  . Alcohol use: No    Alcohol/week: 0.0 standard  drinks  . Drug use: No     Medication list has been reviewed and updated.  Current Meds  Medication Sig  . fluticasone (FLONASE) 50 MCG/ACT nasal spray Place 2 sprays into both nostrils daily.  Marland Kitchen. gabapentin (NEURONTIN) 100 MG capsule Take 1 capsule (100 mg total) by mouth at bedtime.  . Multiple Vitamin (MULTIVITAMIN) tablet Take 1 tablet by  mouth daily.    PHQ 2/9 Scores 09/26/2019 01/31/2019 01/03/2019 09/20/2018  PHQ - 2 Score 0 0 0 0  PHQ- 9 Score 0 - - -    BP Readings from Last 3 Encounters:  09/26/19 132/82  01/31/19 118/80  01/03/19 124/70    Physical Exam Vitals signs and nursing note reviewed.  Constitutional:      Appearance: Normal appearance. He is well-developed.  HENT:     Head: Normocephalic.     Right Ear: Tympanic membrane, ear canal and external ear normal.     Left Ear: Tympanic membrane, ear canal and external ear normal.     Nose: Nose normal.  Eyes:     Conjunctiva/sclera: Conjunctivae normal.     Pupils: Pupils are equal, round, and reactive to light.  Neck:     Musculoskeletal: Normal range of motion and neck supple.     Thyroid: No thyromegaly.     Vascular: No carotid bruit.  Cardiovascular:     Rate and Rhythm: Normal rate and regular rhythm.     Pulses: Normal pulses.     Heart sounds: Normal heart sounds.  Pulmonary:     Effort: Pulmonary effort is normal.     Breath sounds: Normal breath sounds. No wheezing.  Chest:     Breasts:        Right: No mass.        Left: No mass.  Abdominal:     General: Bowel sounds are normal.     Palpations: Abdomen is soft. There is no hepatomegaly or splenomegaly.     Tenderness: There is no abdominal tenderness. There is no guarding or rebound.     Hernia: No hernia is present.  Musculoskeletal: Normal range of motion.     Right lower leg: Edema present.     Left lower leg: Edema present.  Lymphadenopathy:     Cervical: No cervical adenopathy.  Skin:    General: Skin is warm and dry.  Neurological:     Mental Status: He is alert and oriented to person, place, and time.     Deep Tendon Reflexes: Reflexes are normal and symmetric.  Psychiatric:        Speech: Speech normal.        Behavior: Behavior normal.        Thought Content: Thought content normal.        Judgment: Judgment normal.     Wt Readings from Last 3 Encounters:   09/26/19 (!) 326 lb (147.9 kg)  01/31/19 (!) 313 lb (142 kg)  01/03/19 (!) 317 lb (143.8 kg)    BP 132/82   Pulse 73   Ht 5\' 11"  (1.803 m)   Wt (!) 326 lb (147.9 kg)   SpO2 98%   BMI 45.47 kg/m   Assessment and Plan: 1. Annual physical exam Normal exam except for weight Start back on regular exercise and diet changes - CBC with Differential/Platelet - Comprehensive metabolic panel - Lipid panel - POCT urinalysis dipstick  2. Prostate cancer screening DRE deferred - PSA  3. Slow transit constipation Continue fiber supplement with adequate  water with each dose - TSH   Partially dictated using Editor, commissioning. Any errors are unintentional.  Halina Maidens, MD [facility]  09/26/2019

## 2019-09-27 LAB — COMPREHENSIVE METABOLIC PANEL
ALT: 14 IU/L (ref 0–44)
AST: 20 IU/L (ref 0–40)
Albumin/Globulin Ratio: 1.7 (ref 1.2–2.2)
Albumin: 3.8 g/dL (ref 3.8–4.9)
Alkaline Phosphatase: 68 IU/L (ref 39–117)
BUN/Creatinine Ratio: 16 (ref 10–24)
BUN: 15 mg/dL (ref 8–27)
Bilirubin Total: 0.6 mg/dL (ref 0.0–1.2)
CO2: 24 mmol/L (ref 20–29)
Calcium: 8.6 mg/dL (ref 8.6–10.2)
Chloride: 105 mmol/L (ref 96–106)
Creatinine, Ser: 0.95 mg/dL (ref 0.76–1.27)
GFR calc Af Amer: 100 mL/min/{1.73_m2} (ref >59)
GFR calc non Af Amer: 87 mL/min/{1.73_m2} (ref >59)
Globulin, Total: 2.2 g/dL (ref 1.5–4.5)
Glucose: 100 mg/dL — ABNORMAL HIGH (ref 65–99)
Potassium: 4.3 mmol/L (ref 3.5–5.2)
Sodium: 140 mmol/L (ref 134–144)
Total Protein: 6 g/dL (ref 6.0–8.5)

## 2019-09-27 LAB — CBC WITH DIFFERENTIAL/PLATELET
Basophils Absolute: 0 10*3/uL (ref 0.0–0.2)
Basos: 1 %
EOS (ABSOLUTE): 0.1 10*3/uL (ref 0.0–0.4)
Eos: 3 %
Hematocrit: 43.9 % (ref 37.5–51.0)
Hemoglobin: 14.9 g/dL (ref 13.0–17.7)
Immature Grans (Abs): 0 10*3/uL (ref 0.0–0.1)
Immature Granulocytes: 0 %
Lymphocytes Absolute: 1.3 10*3/uL (ref 0.7–3.1)
Lymphs: 44 %
MCH: 29.6 pg (ref 26.6–33.0)
MCHC: 33.9 g/dL (ref 31.5–35.7)
MCV: 87 fL (ref 79–97)
Monocytes Absolute: 0.5 10*3/uL (ref 0.1–0.9)
Monocytes: 17 %
Neutrophils Absolute: 1 10*3/uL — ABNORMAL LOW (ref 1.4–7.0)
Neutrophils: 35 %
Platelets: 155 10*3/uL (ref 150–450)
RBC: 5.03 x10E6/uL (ref 4.14–5.80)
RDW: 12.6 % (ref 11.6–15.4)
WBC: 3 10*3/uL — ABNORMAL LOW (ref 3.4–10.8)

## 2019-09-27 LAB — PSA: Prostate Specific Ag, Serum: 0.4 ng/mL (ref 0.0–4.0)

## 2019-09-27 LAB — LIPID PANEL
Chol/HDL Ratio: 2.3 ratio (ref 0.0–5.0)
Cholesterol, Total: 161 mg/dL (ref 100–199)
HDL: 71 mg/dL (ref >39)
LDL Chol Calc (NIH): 82 mg/dL (ref 0–99)
Triglycerides: 32 mg/dL (ref 0–149)
VLDL Cholesterol Cal: 8 mg/dL (ref 5–40)

## 2019-09-27 LAB — TSH: TSH: 1.15 u[IU]/mL (ref 0.450–4.500)

## 2019-10-02 ENCOUNTER — Other Ambulatory Visit: Payer: Self-pay | Admitting: Internal Medicine

## 2019-10-02 DIAGNOSIS — M47894 Other spondylosis, thoracic region: Secondary | ICD-10-CM

## 2020-04-30 ENCOUNTER — Encounter: Payer: Self-pay | Admitting: Family Medicine

## 2020-04-30 ENCOUNTER — Ambulatory Visit: Payer: Managed Care, Other (non HMO) | Admitting: Family Medicine

## 2020-04-30 ENCOUNTER — Other Ambulatory Visit: Payer: Self-pay

## 2020-04-30 VITALS — BP 136/78 | HR 80 | Ht 71.0 in | Wt 332.0 lb

## 2020-04-30 DIAGNOSIS — H6981 Other specified disorders of Eustachian tube, right ear: Secondary | ICD-10-CM | POA: Diagnosis not present

## 2020-04-30 DIAGNOSIS — H6505 Acute serous otitis media, recurrent, left ear: Secondary | ICD-10-CM | POA: Diagnosis not present

## 2020-04-30 DIAGNOSIS — B372 Candidiasis of skin and nail: Secondary | ICD-10-CM

## 2020-04-30 MED ORDER — FLUCONAZOLE 150 MG PO TABS: 150.0000 mg | ORAL_TABLET | Freq: Once | ORAL | 0 refills | Status: AC

## 2020-04-30 MED ORDER — NYSTATIN 100000 UNIT/GM EX CREA: 1.0000 "application " | TOPICAL_CREAM | Freq: Two times a day (BID) | CUTANEOUS | 6 refills | Status: DC

## 2020-04-30 NOTE — Progress Notes (Signed)
Date:  04/30/2020   Name:  Matthew Schaefer.   DOB:  07/23/1961   MRN:  [id number]   Chief Complaint: Rash (X1 month getting worse, used different ointments, both sides of back, itching on the inside, peeling right now, not draining) and Ear Pain (discomfort left ear, pressure and itching )  Rash This is a new problem. The current episode started more than 1 month ago. The problem is unchanged. Location: flank area /folds. The rash is characterized by redness and itchiness. Pertinent negatives include no anorexia, congestion, cough, diarrhea, eye pain, facial edema, fatigue, fever, joint pain, nail changes, rhinorrhea, shortness of breath, sore throat or vomiting. Past treatments include antibiotic cream, anti-itch cream and antihistamine (monostat). The treatment provided moderate relief.  Otalgia  There is pain in the right ear. This is a chronic problem. The current episode started more than 1 year ago. The problem has been waxing and waning. There has been no fever. Associated symptoms include a rash. Pertinent negatives include no abdominal pain, coughing, diarrhea, ear discharge, headaches, neck pain, rhinorrhea, sore throat or vomiting. Treatments tried: nasal steroid/pseudoephedrine.    Lab Results  Component Value Date   CREATININE 0.95 09/26/2019   BUN 15 09/26/2019   NA 140 09/26/2019   K 4.3 09/26/2019   CL 105 09/26/2019   CO2 24 09/26/2019   Lab Results  Component Value Date   CHOL 161 09/26/2019   HDL 71 09/26/2019   LDLCALC 82 09/26/2019   TRIG 32 09/26/2019   CHOLHDL 2.3 09/26/2019   Lab Results  Component Value Date   TSH 1.150 09/26/2019   Lab Results  Component Value Date   HGBA1C 5.1 09/20/2018   Lab Results  Component Value Date   WBC 3.0 (L) 09/26/2019   HGB 14.9 09/26/2019   HCT 43.9 09/26/2019   MCV 87 09/26/2019   PLT 155 09/26/2019   Lab Results  Component Value Date   ALT 14 09/26/2019   AST 20 09/26/2019   ALKPHOS 68 09/26/2019    BILITOT 0.6 09/26/2019     Review of Systems  Constitutional: Negative for chills, fatigue and fever.  HENT: Negative for congestion, drooling, ear discharge, ear pain, rhinorrhea and sore throat.   Eyes: Negative for pain.  Respiratory: Negative for cough, shortness of breath and wheezing.   Cardiovascular: Negative for chest pain, palpitations and leg swelling.  Gastrointestinal: Negative for abdominal pain, anorexia, blood in stool, constipation, diarrhea, nausea and vomiting.  Endocrine: Negative for polydipsia.  Genitourinary: Negative for dysuria, frequency, hematuria and urgency.  Musculoskeletal: Negative for back pain, joint pain, myalgias and neck pain.  Skin: Positive for rash. Negative for nail changes.  Allergic/Immunologic: Negative for environmental allergies.  Neurological: Negative for dizziness and headaches.  Hematological: Does not bruise/bleed easily.  Psychiatric/Behavioral: Negative for suicidal ideas. The patient is not nervous/anxious.     Patient Active Problem List   Diagnosis Date Noted  . BMI 50.0-59.9, adult ([place]) 03/03/2016  . Environmental and seasonal allergies 05/01/2015  . GERD without esophagitis 05/01/2015  . Osteoarthritis of spine 05/01/2015    Allergies  Allergen Reactions  . Augmentin [Amoxicillin-Pot Clavulanate] Palpitations    Past Surgical History:  Procedure Laterality Date  . COLONOSCOPY  2011   normal  . ESOPHAGOGASTRODUODENOSCOPY  2013   HH, gastritis, H Pylori (-)  . REPLACEMENT TOTAL KNEE BILATERAL Bilateral 2009    Social History   Tobacco Use  . Smoking status: Never Smoker  . Smokeless tobacco: Current  User    Types: Snuff  Vaping Use  . Vaping Use: Never used  Substance Use Topics  . Alcohol use: No    Alcohol/week: 0.0 standard drinks  . Drug use: No     Medication list has been reviewed and updated.  Current Meds  Medication Sig  . fluticasone (FLONASE) 50 MCG/ACT nasal spray Place 2 sprays into  both nostrils daily.  Marland Kitchen gabapentin (NEURONTIN) 100 MG capsule Take 1 capsule by mouth at bedtime  . Loratadine (CLARITIN PO) Take 1 tablet by mouth as needed.  . Multiple Vitamin (MULTIVITAMIN) tablet Take 1 tablet by mouth daily.    PHQ 2/9 Scores 04/30/2020 09/26/2019 01/31/2019 01/03/2019  PHQ - 2 Score 0 0 0 0  PHQ- 9 Score 0 0 - -    GAD 7 : Generalized Anxiety Score 04/30/2020  Nervous, Anxious, on Edge 0  Control/stop worrying 0  Worry too much - different things 0  Trouble relaxing 0  Restless 0  Easily annoyed or irritable 0  Afraid - awful might happen 0  Total GAD 7 Score 0  Anxiety Difficulty Not difficult at all    BP Readings from Last 3 Encounters:  04/30/20 136/78  09/26/19 132/82  01/31/19 118/80    Physical Exam Vitals and nursing note reviewed.  HENT:     Head: Normocephalic.     Jaw: There is normal jaw occlusion.     Right Ear: Ear canal and external ear normal. There is no impacted cerumen. Tympanic membrane is retracted.     Left Ear: Ear canal and external ear normal. A middle ear effusion is present. There is no impacted cerumen.     Nose: Nose normal. No congestion.  Eyes:     General: No scleral icterus.       Right eye: No discharge.        Left eye: No discharge.     Conjunctiva/sclera: Conjunctivae normal.     Pupils: Pupils are equal, round, and reactive to light.  Neck:     Thyroid: No thyromegaly.     Vascular: No JVD.     Trachea: No tracheal deviation.  Cardiovascular:     Rate and Rhythm: Normal rate and regular rhythm.     Heart sounds: Normal heart sounds. No murmur heard.  No friction rub. No gallop.   Pulmonary:     Effort: No respiratory distress.     Breath sounds: Normal breath sounds. No wheezing or rales.  Abdominal:     General: Bowel sounds are normal.     Palpations: Abdomen is soft. There is no mass.     Tenderness: There is no abdominal tenderness. There is no guarding or rebound.  Musculoskeletal:         General: No tenderness. Normal range of motion.     Cervical back: Normal range of motion and neck supple.  Lymphadenopathy:     Cervical: No cervical adenopathy.  Skin:    General: Skin is warm.     Findings: Erythema and rash present. Rash is macular.          Comments: Rash of skin folds bilateral  Neurological:     Mental Status: He is alert and oriented to person, place, and time.     Cranial Nerves: No cranial nerve deficit.     Deep Tendon Reflexes: Reflexes are normal and symmetric.     Wt Readings from Last 3 Encounters:  04/30/20 (!) 332 lb (150.6 kg)  09/26/19 Marland Kitchen)  326 lb (147.9 kg)  01/31/19 (!) 313 lb (142 kg)    BP 136/78   Pulse 80   Ht 5\' 11"  (1.803 m)   Wt (!) 332 lb (150.6 kg)   SpO2 97%   BMI 46.30 kg/m   Assessment and Plan: 1. Candidiasis, cutaneous Acute.  Recurrent.  Areas in the skin fold of her pannus formation on both sides.  This is been noted on previous occasions and has responded to topical treatment in the past.  We will prescribe nystatin cream after patient has gently removed the crusting on both sides to get access to the rash with the cream.  This to be applied twice a day and if this should not resolve then our next step is to consider Lotrimin.  Patient is also been given a Diflucan tablet to take as a one-time dosing to get a jump start on the yeast infection. - nystatin cream (MYCOSTATIN); Apply 1 application topically 2 (two) times daily.  Dispense: 30 g; Refill: 6 - fluconazole (DIFLUCAN) 150 MG tablet; Take 1 tablet (150 mg total) by mouth once for 1 dose.  Dispense: 1 tablet; Refill: 0  2. Recurrent acute serous otitis media of left ear Recent onset.  Recurrent.  Exam is consistent with a serous otitis on the left side.  There is no erythema to suggest otitis media.  We will treat with nasal steroid of choice Flonase versus Nasacort was discussed.  Patient is also been encouraged to take Sudafed 30 mg.  3. Acute dysfunction of  Eustachian tube, right Right side is noted to have a retracted tympanic membrane consistent with dysfunctional eustachian tubes probably secondary to allergies.

## 2020-09-26 ENCOUNTER — Other Ambulatory Visit: Payer: Self-pay

## 2020-09-26 ENCOUNTER — Ambulatory Visit (INDEPENDENT_AMBULATORY_CARE_PROVIDER_SITE_OTHER): Payer: Managed Care, Other (non HMO) | Admitting: Internal Medicine

## 2020-09-26 ENCOUNTER — Encounter: Payer: Self-pay | Admitting: Internal Medicine

## 2020-09-26 VITALS — BP 132/80 | HR 63 | Temp 98.0°F | Ht 71.0 in | Wt 333.0 lb

## 2020-09-26 DIAGNOSIS — Z1211 Encounter for screening for malignant neoplasm of colon: Secondary | ICD-10-CM

## 2020-09-26 DIAGNOSIS — M47894 Other spondylosis, thoracic region: Secondary | ICD-10-CM | POA: Diagnosis not present

## 2020-09-26 DIAGNOSIS — Z125 Encounter for screening for malignant neoplasm of prostate: Secondary | ICD-10-CM

## 2020-09-26 DIAGNOSIS — Z6841 Body Mass Index (BMI) 40.0 and over, adult: Secondary | ICD-10-CM

## 2020-09-26 DIAGNOSIS — Z0000 Encounter for general adult medical examination without abnormal findings: Secondary | ICD-10-CM

## 2020-09-26 LAB — POCT URINALYSIS DIPSTICK
Bilirubin, UA: NEGATIVE
Blood, UA: NEGATIVE
Glucose, UA: NEGATIVE
Ketones, UA: NEGATIVE
Leukocytes, UA: NEGATIVE
Nitrite, UA: NEGATIVE
Protein, UA: NEGATIVE
Spec Grav, UA: 1.02 (ref 1.010–1.025)
Urobilinogen, UA: 0.2 E.U./dL
pH, UA: 6 (ref 5.0–8.0)

## 2020-09-26 NOTE — Patient Instructions (Signed)
Consider the Shingles vaccine - can call for a Nurse visit

## 2020-09-26 NOTE — Progress Notes (Signed)
Date:  09/26/2020   Name:  Matthew Schaefer.   DOB:  1960/11/23   MRN:  [id number]   Chief Complaint: Annual Exam  Matthew Schaefer. is a 59 y.o. male who presents today for his Complete Annual Exam. He feels well. He reports exercising cardio and weight lifting every day. He reports he is sleeping well. He is still trying to lose weight with low carb diet and intermittent fasting.  Colonoscopy: 09/2010  Immunization History  Administered Date(s) Administered  . Tdap 09/05/2013    HPI  Lab Results  Component Value Date   CREATININE 0.95 09/26/2019   BUN 15 09/26/2019   NA 140 09/26/2019   K 4.3 09/26/2019   CL 105 09/26/2019   CO2 24 09/26/2019   Lab Results  Component Value Date   CHOL 161 09/26/2019   HDL 71 09/26/2019   LDLCALC 82 09/26/2019   TRIG 32 09/26/2019   CHOLHDL 2.3 09/26/2019   Lab Results  Component Value Date   TSH 1.150 09/26/2019   Lab Results  Component Value Date   HGBA1C 5.1 09/20/2018   Lab Results  Component Value Date   WBC 3.0 (L) 09/26/2019   HGB 14.9 09/26/2019   HCT 43.9 09/26/2019   MCV 87 09/26/2019   PLT 155 09/26/2019   Lab Results  Component Value Date   ALT 14 09/26/2019   AST 20 09/26/2019   ALKPHOS 68 09/26/2019   BILITOT 0.6 09/26/2019     Review of Systems  Constitutional: Negative for appetite change, chills, diaphoresis, fatigue and unexpected weight change.  HENT: Negative for hearing loss, tinnitus, trouble swallowing and voice change.   Eyes: Negative for visual disturbance.  Respiratory: Negative for choking, shortness of breath and wheezing.   Cardiovascular: Negative for chest pain, palpitations and leg swelling.  Gastrointestinal: Negative for abdominal pain, blood in stool, constipation and diarrhea.  Endocrine: Negative for polydipsia and polyuria.  Genitourinary: Negative for difficulty urinating, dysuria, frequency and hematuria.  Musculoskeletal: Negative for arthralgias, back pain and myalgias.   Skin: Negative for color change and rash.  Allergic/Immunologic: Negative for environmental allergies.  Neurological: Positive for dizziness. Negative for syncope and headaches.  Hematological: Negative for adenopathy.  Psychiatric/Behavioral: Negative for dysphoric mood and sleep disturbance. The patient is not nervous/anxious.     Patient Active Problem List   Diagnosis Date Noted  . BMI 50.0-59.9, adult (HCC) 03/03/2016  . Environmental and seasonal allergies 05/01/2015  . GERD without esophagitis 05/01/2015  . Osteoarthritis of spine 05/01/2015    Allergies  Allergen Reactions  . Augmentin [Amoxicillin-Pot Clavulanate] Palpitations    Past Surgical History:  Procedure Laterality Date  . COLONOSCOPY  2011   normal  . ESOPHAGOGASTRODUODENOSCOPY  2013   HH, gastritis, H Pylori (-)  . REPLACEMENT TOTAL KNEE BILATERAL Bilateral 2009    Social History   Tobacco Use  . Smoking status: Never Smoker  . Smokeless tobacco: Current User    Types: Snuff  Vaping Use  . Vaping Use: Never used  Substance Use Topics  . Alcohol use: No    Alcohol/week: 0.0 standard drinks  . Drug use: No     Medication list has been reviewed and updated.  Current Meds  Medication Sig  . fluticasone (FLONASE) 50 MCG/ACT nasal spray Place 2 sprays into both nostrils daily.  Marland Kitchen gabapentin (NEURONTIN) 100 MG capsule Take 1 capsule by mouth at bedtime  . Loratadine (CLARITIN PO) Take 1 tablet by mouth as needed.  Marland Kitchen  Multiple Vitamin (MULTIVITAMIN) tablet Take 1 tablet by mouth daily.  Marland Kitchen nystatin cream (MYCOSTATIN) Apply 1 application topically 2 (two) times daily. (Patient taking differently: Apply 1 application topically as needed. )    PHQ 2/9 Scores 09/26/2020 04/30/2020 09/26/2019 01/31/2019  PHQ - 2 Score 0 0 0 0  PHQ- 9 Score 0 0 0 -    GAD 7 : Generalized Anxiety Score 04/30/2020  Nervous, Anxious, on Edge 0  Control/stop worrying 0  Worry too much - different things 0  Trouble  relaxing 0  Restless 0  Easily annoyed or irritable 0  Afraid - awful might happen 0  Total GAD 7 Score 0  Anxiety Difficulty Not difficult at all    BP Readings from Last 3 Encounters:  09/26/20 132/80  04/30/20 136/78  09/26/19 132/82    Physical Exam Vitals and nursing note reviewed.  Constitutional:      Appearance: Normal appearance. He is well-developed.  HENT:     Head: Normocephalic.     Right Ear: Tympanic membrane, ear canal and external ear normal.     Left Ear: Tympanic membrane, ear canal and external ear normal.     Nose: Nose normal.     Mouth/Throat:     Pharynx: Uvula midline.  Eyes:     Conjunctiva/sclera: Conjunctivae normal.     Pupils: Pupils are equal, round, and reactive to light.  Neck:     Thyroid: No thyromegaly.     Vascular: No carotid bruit.  Cardiovascular:     Rate and Rhythm: Normal rate and regular rhythm.     Heart sounds: Normal heart sounds.  Pulmonary:     Effort: Pulmonary effort is normal.     Breath sounds: Normal breath sounds. No wheezing.  Chest:     Breasts:        Right: No mass.        Left: No mass.  Abdominal:     General: Bowel sounds are normal.     Palpations: Abdomen is soft.     Tenderness: There is no abdominal tenderness.  Musculoskeletal:        General: Normal range of motion.     Cervical back: Normal range of motion and neck supple.     Right lower leg: No edema.     Left lower leg: No edema.  Lymphadenopathy:     Cervical: No cervical adenopathy.  Skin:    General: Skin is warm and dry.     Capillary Refill: Capillary refill takes less than 2 seconds.  Neurological:     General: No focal deficit present.     Mental Status: He is alert and oriented to person, place, and time.     Deep Tendon Reflexes: Reflexes are normal and symmetric.  Psychiatric:        Mood and Affect: Mood normal.        Speech: Speech normal.     Wt Readings from Last 3 Encounters:  09/26/20 (!) 333 lb (151 kg)    04/30/20 (!) 332 lb (150.6 kg)  09/26/19 (!) 326 lb (147.9 kg)    BP 132/80   Pulse 63   Temp 98 F (36.7 C) (Oral)   Ht 5\' 11"  (1.803 m)   Wt (!) 333 lb (151 kg)   SpO2 97%   BMI 46.44 kg/m   Assessment and Plan: 1. Annual physical exam Exam is normal except for weight. Encourage regular exercise and appropriate dietary changes. Discussed Shingrix vaccine -  he will consider these - CBC with Differential/Platelet - Comprehensive metabolic panel - Lipid panel - POCT urinalysis dipstick  2. Colon cancer screening Due for 10 yr colonoscopy - Ambulatory referral to Gastroenterology  3. Prostate cancer screening DRE deferred  - PSA  4. Other osteoarthritis of spine, thoracic region Continue gabapentin PRN  5. BMI 45.0-49.9, adult (HCC) Continue regular exercise, dietary modifications Discussed GLP-1 medications for weight loss briefly   Partially dictated using Animal nutritionist. Any errors are unintentional.  Bari Edward, MD [facility]  09/26/2020

## 2020-09-27 LAB — CBC WITH DIFFERENTIAL/PLATELET
Basophils Absolute: 0 10*3/uL (ref 0.0–0.2)
Basos: 1 %
EOS (ABSOLUTE): 0.1 10*3/uL (ref 0.0–0.4)
Eos: 3 %
Hematocrit: 47.5 % (ref 37.5–51.0)
Hemoglobin: 16.1 g/dL (ref 13.0–17.7)
Immature Grans (Abs): 0 10*3/uL (ref 0.0–0.1)
Immature Granulocytes: 0 %
Lymphocytes Absolute: 1.3 10*3/uL (ref 0.7–3.1)
Lymphs: 35 %
MCH: 29.6 pg (ref 26.6–33.0)
MCHC: 33.9 g/dL (ref 31.5–35.7)
MCV: 87 fL (ref 79–97)
Monocytes Absolute: 0.5 10*3/uL (ref 0.1–0.9)
Monocytes: 13 %
Neutrophils Absolute: 1.8 10*3/uL (ref 1.4–7.0)
Neutrophils: 48 %
Platelets: 160 10*3/uL (ref 150–450)
RBC: 5.44 x10E6/uL (ref 4.14–5.80)
RDW: 12.7 % (ref 11.6–15.4)
WBC: 3.7 10*3/uL (ref 3.4–10.8)

## 2020-09-27 LAB — COMPREHENSIVE METABOLIC PANEL
ALT: 19 IU/L (ref 0–44)
AST: 27 IU/L (ref 0–40)
Albumin/Globulin Ratio: 1.6 (ref 1.2–2.2)
Albumin: 4.1 g/dL (ref 3.8–4.8)
Alkaline Phosphatase: 76 IU/L (ref 44–121)
BUN/Creatinine Ratio: 16 (ref 10–24)
BUN: 17 mg/dL (ref 8–27)
Bilirubin Total: 0.7 mg/dL (ref 0.0–1.2)
CO2: 22 mmol/L (ref 20–29)
Calcium: 8.9 mg/dL (ref 8.6–10.2)
Chloride: 104 mmol/L (ref 96–106)
Creatinine, Ser: 1.05 mg/dL (ref 0.76–1.27)
GFR calc Af Amer: 88 mL/min/{1.73_m2} (ref >59)
GFR calc non Af Amer: 76 mL/min/{1.73_m2} (ref >59)
Globulin, Total: 2.6 g/dL (ref 1.5–4.5)
Glucose: 90 mg/dL (ref 65–99)
Potassium: 4.6 mmol/L (ref 3.5–5.2)
Sodium: 142 mmol/L (ref 134–144)
Total Protein: 6.7 g/dL (ref 6.0–8.5)

## 2020-09-27 LAB — LIPID PANEL
Chol/HDL Ratio: 2.2 ratio (ref 0.0–5.0)
Cholesterol, Total: 173 mg/dL (ref 100–199)
HDL: 78 mg/dL (ref >39)
LDL Chol Calc (NIH): 84 mg/dL (ref 0–99)
Triglycerides: 53 mg/dL (ref 0–149)
VLDL Cholesterol Cal: 11 mg/dL (ref 5–40)

## 2020-09-27 LAB — PSA: Prostate Specific Ag, Serum: 0.4 ng/mL (ref 0.0–4.0)

## 2020-10-08 ENCOUNTER — Telehealth (INDEPENDENT_AMBULATORY_CARE_PROVIDER_SITE_OTHER): Payer: Self-pay | Admitting: Gastroenterology

## 2020-10-08 ENCOUNTER — Other Ambulatory Visit: Payer: Self-pay

## 2020-10-08 DIAGNOSIS — Z1211 Encounter for screening for malignant neoplasm of colon: Secondary | ICD-10-CM

## 2020-10-08 MED ORDER — NA SULFATE-K SULFATE-MG SULF 17.5-3.13-1.6 GM/177ML PO SOLN: 1.0000 | Freq: Once | ORAL | 0 refills | Status: AC

## 2020-10-08 NOTE — Progress Notes (Signed)
Gastroenterology Pre-Procedure Review  Request Date: 11/16/20 Requesting Physician: Dr. Vicente Males  PATIENT REVIEW QUESTIONS: The patient responded to the following health history questions as indicated:    1. Are you having any GI issues? no 2. Do you have a personal history of Polyps? no 3. Do you have a family history of Colon Cancer or Polyps? yes (sister colon polyps) 4. Diabetes Mellitus? no 5. Joint replacements in the past 12 months?no 6. Major health problems in the past 3 months?no 7. Any artificial heart valves, MVP, or defibrillator?no    MEDICATIONS & ALLERGIES:    Patient reports the following regarding taking any anticoagulation/antiplatelet therapy:   Plavix, Coumadin, Eliquis, Xarelto, Lovenox, Pradaxa, Brilinta, or Effient? no Aspirin? no  Patient confirms/reports the following medications:  Current Outpatient Medications  Medication Sig Dispense Refill  . fluticasone (FLONASE) 50 MCG/ACT nasal spray Place 2 sprays into both nostrils daily. 16 g 2  . gabapentin (NEURONTIN) 100 MG capsule Take 1 capsule by mouth at bedtime 30 capsule 12  . Loratadine (CLARITIN PO) Take 1 tablet by mouth as needed.    . Multiple Vitamin (MULTIVITAMIN) tablet Take 1 tablet by mouth daily.    Marland Kitchen nystatin cream (MYCOSTATIN) Apply 1 application topically 2 (two) times daily. (Patient taking differently: Apply 1 application topically as needed. ) 30 g 6  . Na Sulfate-K Sulfate-Mg Sulf 17.5-3.13-1.6 GM/177ML SOLN Take 1 kit by mouth once for 1 dose. 354 mL 0   No current facility-administered medications for this visit.    Patient confirms/reports the following allergies:  Allergies  Allergen Reactions  . Augmentin [Amoxicillin-Pot Clavulanate] Palpitations    Orders Placed This Encounter  Procedures  . Procedural/ Surgical Case Request: COLONOSCOPY WITH PROPOFOL    Standing Status:   Standing    Number of Occurrences:   1    Order Specific Question:   Pre-op diagnosis    Answer:    screening colonoscopy    Order Specific Question:   CPT Code    Answer:   02585    AUTHORIZATION INFORMATION Primary Insurance: 1D#: Group #:  Secondary Insurance: 1D#: Group #:  SCHEDULE INFORMATION: Date: 11/16/20 Time: Location:[facility]

## 2020-11-14 ENCOUNTER — Other Ambulatory Visit: Payer: Self-pay

## 2020-11-14 ENCOUNTER — Other Ambulatory Visit
Admission: RE | Admit: 2020-11-14 | Discharge: 2020-11-14 | Disposition: A | Discharge status: Home / Self Care | Payer: Managed Care, Other (non HMO) | Source: Ambulatory Visit | Attending: Gastroenterology | Admitting: Gastroenterology

## 2020-11-14 DIAGNOSIS — Z20822 Contact with and (suspected) exposure to covid-19: Secondary | ICD-10-CM | POA: Insufficient documentation

## 2020-11-14 DIAGNOSIS — Z01812 Encounter for preprocedural laboratory examination: Secondary | ICD-10-CM | POA: Insufficient documentation

## 2020-11-15 LAB — SARS CORONAVIRUS 2 (TAT 6-24 HRS): SARS Coronavirus 2: NEGATIVE

## 2020-11-16 ENCOUNTER — Ambulatory Visit: Payer: Managed Care, Other (non HMO) | Admitting: Certified Registered"

## 2020-11-16 ENCOUNTER — Encounter
Admission: RE | Disposition: A | Discharge status: Home / Self Care | Payer: Self-pay | Source: Home / Self Care | Attending: Gastroenterology

## 2020-11-16 ENCOUNTER — Encounter: Payer: Self-pay | Admitting: Gastroenterology

## 2020-11-16 ENCOUNTER — Ambulatory Visit
Admission: RE | Admit: 2020-11-16 | Discharge: 2020-11-16 | Disposition: A | Discharge status: Home / Self Care | Payer: Managed Care, Other (non HMO) | Attending: Gastroenterology | Admitting: Gastroenterology

## 2020-11-16 DIAGNOSIS — Z880 Allergy status to penicillin: Secondary | ICD-10-CM | POA: Insufficient documentation

## 2020-11-16 DIAGNOSIS — Z79899 Other long term (current) drug therapy: Secondary | ICD-10-CM | POA: Diagnosis not present

## 2020-11-16 DIAGNOSIS — K635 Polyp of colon: Secondary | ICD-10-CM | POA: Diagnosis not present

## 2020-11-16 DIAGNOSIS — Z1211 Encounter for screening for malignant neoplasm of colon: Secondary | ICD-10-CM | POA: Insufficient documentation

## 2020-11-16 DIAGNOSIS — Z8371 Family history of colonic polyps: Secondary | ICD-10-CM | POA: Diagnosis present

## 2020-11-16 HISTORY — PX: COLONOSCOPY WITH PROPOFOL: SHX5780

## 2020-11-16 SURGERY — COLONOSCOPY WITH PROPOFOL
Anesthesia: General

## 2020-11-16 MED ORDER — SODIUM CHLORIDE (PF) 0.9 % IJ SOLN
INTRAMUSCULAR | Status: DC | PRN
  Administered 2020-11-16: 2 mL

## 2020-11-16 MED ORDER — PROPOFOL 500 MG/50ML IV EMUL
INTRAVENOUS | Status: DC | PRN
  Administered 2020-11-16: 165 ug/kg/min via INTRAVENOUS

## 2020-11-16 MED ORDER — SODIUM CHLORIDE 0.9 % IV SOLN: INTRAVENOUS | Status: DC

## 2020-11-16 MED ORDER — GLYCOPYRROLATE 0.2 MG/ML IJ SOLN
INTRAMUSCULAR | Status: DC | PRN
  Administered 2020-11-16: .2 mg via INTRAVENOUS

## 2020-11-16 MED ORDER — LIDOCAINE HCL (CARDIAC) PF 100 MG/5ML IV SOSY
PREFILLED_SYRINGE | INTRAVENOUS | Status: DC | PRN
  Administered 2020-11-16: 100 mg via INTRAVENOUS

## 2020-11-16 MED ORDER — PROPOFOL 10 MG/ML IV BOLUS
INTRAVENOUS | Status: DC | PRN
  Administered 2020-11-16: 20 mg via INTRAVENOUS
  Administered 2020-11-16: 60 mg via INTRAVENOUS

## 2020-11-16 NOTE — Transfer of Care (Signed)
Immediate Anesthesia Transfer of Care Note  Patient: Matthew Schaefer.  Procedure(s) Performed: COLONOSCOPY WITH PROPOFOL (N/A )  Patient Location: Endoscopy Unit  Anesthesia Type:General  Level of Consciousness: drowsy and patient cooperative  Airway & Oxygen Therapy: Patient Spontanous Breathing and Patient connected to face mask oxygen  Post-op Assessment: Report given to RN and Post -op Vital signs reviewed and stable  Post vital signs: Reviewed and stable  Last Vitals:  Vitals Value Taken Time  BP    Temp    Pulse    Resp    SpO2      Last Pain:  Vitals:   11/16/20 0902  TempSrc: Temporal  PainSc: 0-No pain         Complications: No complications documented.

## 2020-11-16 NOTE — Op Note (Addendum)
[facility] Gastroenterology Patient Name: Matthew Schaefer Procedure Date: 11/16/2020 10:02 AM MRN: [id number] Account #: [id number] Date of Birth: 1960/11/23 Admit Type: Outpatient Age: 60 Room: [facility] ENDO ROOM 4 Gender: Male Note Status: Finalized Procedure:             Colonoscopy Indications:           Colon cancer screening in patient at increased risk:                         Family history of 1st-degree relative with colon polyps Providers:             Jonathon Bellows MD, MD Referring MD:          Halina Maidens, MD (Referring MD) Medicines:             Monitored Anesthesia Care Complications:         No immediate complications. Procedure:             Pre-Anesthesia Assessment:                        - Prior to the procedure, a History and Physical was                         performed, and patient medications, allergies and                         sensitivities were reviewed. The patient's tolerance                         of previous anesthesia was reviewed.                        - The risks and benefits of the procedure and the                         sedation options and risks were discussed with the                         patient. All questions were answered and informed                         consent was obtained.                        - ASA Grade Assessment: II - A patient with mild                         systemic disease.                        After obtaining informed consent, the colonoscope was                         passed under direct vision. Throughout the procedure,                         the patient's blood pressure, pulse, and oxygen                         saturations  were monitored continuously. The                         Colonoscope was introduced through the anus and                         advanced to the the cecum, identified by the                         appendiceal orifice. The colonoscopy was performed                         with  ease. The patient tolerated the procedure well.                         The quality of the bowel preparation was excellent. Findings:      The perianal and digital rectal examinations were normal.      Two sessile polyps were found in the ascending colon. The polyps were 6       to 8 mm in size. These polyps were removed with a cold snare. Resection       and retrieval were complete.      A 15 mm polyp was found in the ascending colon. The polyp was       semi-pedunculated. Preparations were made for mucosal resection. Saline       was injected to raise the lesion. Snare mucosal resection was performed.       Resection and retrieval were complete. To prevent bleeding after the       polypectomy, two hemostatic clips were successfully placed. There was no       bleeding during, or at the end, of the procedure. Impression:            - Two 6 to 8 mm polyps in the ascending colon, removed                         with a cold snare. Resected and retrieved.                        - One 15 mm polyp in the ascending colon, removed with                         mucosal resection. Resected and retrieved. Clips were                         placed.                        - Mucosal resection was performed. Resection and                         retrieval were complete. Recommendation:        - Discharge patient to home (with escort).                        - Resume previous diet.                        - Continue present medications.                        -  Await pathology results.                        - Repeat colonoscopy for surveillance based on                         pathology results. Procedure Code(s):     --- Professional ---                        [phone], Colonoscopy, flexible; with removal of                         tumor(s), polyp(s), or other lesion(s) by snare                         technique Diagnosis Code(s):     --- Professional ---                        K63.5, Polyp of colon                         Z83.71, Family history of colonic polyps CPT copyright 2019 [employer] Association. All rights reserved. The codes documented in this report are preliminary and upon coder review may  be revised to meet current compliance requirements. Wyline Mood, MD Wyline Mood MD, MD 11/16/2020 10:31:36 AM This report has been signed electronically. Number of Addenda: 0 Note Initiated On: 11/16/2020 10:02 AM Scope Withdrawal Time: 0 hours 16 minutes 24 seconds  Total Procedure Duration: 0 hours 20 minutes 29 seconds  Estimated Blood Loss:  Estimated blood loss: none.      [facility]

## 2020-11-16 NOTE — Anesthesia Preprocedure Evaluation (Signed)
Anesthesia Evaluation  Patient identified by MRN, date of birth, ID band Patient awake    Reviewed: Allergy & Precautions, H&P , NPO status , Patient's Chart, lab work & pertinent test results, reviewed documented beta blocker date and time   History of Anesthesia Complications Negative for: history of anesthetic complications  Airway Mallampati: I  TM Distance: >3 FB Neck ROM: full    Dental  (+) Dental Advidsory Given, Caps, Teeth Intact, Missing   Pulmonary neg pulmonary ROS,    Pulmonary exam normal breath sounds clear to auscultation       Cardiovascular Exercise Tolerance: Good hypertension, (-) angina(-) Past MI and (-) Cardiac Stents Normal cardiovascular exam(-) dysrhythmias (-) Valvular Problems/Murmurs Rhythm:regular Rate:Normal     Neuro/Psych negative neurological ROS  negative psych ROS   GI/Hepatic negative GI ROS, Neg liver ROS,   Endo/Other  neg diabetesMorbid obesity  Renal/GU negative Renal ROS  negative genitourinary   Musculoskeletal   Abdominal   Peds  Hematology negative hematology ROS (+)   Anesthesia Other Findings Past Medical History: 05/01/2015: Essential hypertension No date: Hypertension   Reproductive/Obstetrics negative OB ROS                             Anesthesia Physical Anesthesia Plan  ASA: III  Anesthesia Plan: General   Post-op Pain Management:    Induction: Intravenous  PONV Risk Score and Plan: 2 and TIVA and Propofol infusion  Airway Management Planned: Nasal Cannula and Natural Airway  Additional Equipment:   Intra-op Plan:   Post-operative Plan:   Informed Consent: I have reviewed the patients History and Physical, chart, labs and discussed the procedure including the risks, benefits and alternatives for the proposed anesthesia with the patient or authorized representative who has indicated his/her understanding and acceptance.      Dental Advisory Given  Plan Discussed with: Anesthesiologist, CRNA and Surgeon  Anesthesia Plan Comments:         Anesthesia Quick Evaluation

## 2020-11-16 NOTE — Anesthesia Postprocedure Evaluation (Signed)
Anesthesia Post Note  Patient: Matthew Schaefer.  Procedure(s) Performed: COLONOSCOPY WITH PROPOFOL (N/A )  Patient location during evaluation: Endoscopy Anesthesia Type: General Level of consciousness: awake and alert Pain management: pain level controlled Vital Signs Assessment: post-procedure vital signs reviewed and stable Respiratory status: spontaneous breathing, nonlabored ventilation, respiratory function stable and patient connected to nasal cannula oxygen Cardiovascular status: blood pressure returned to baseline and stable Postop Assessment: no apparent nausea or vomiting Anesthetic complications: no   No complications documented.   Last Vitals:  Vitals:   11/16/20 1042 11/16/20 1052  BP: 136/76 132/74  Pulse: 72 (!) 53  Resp: (!) 22 19  Temp:    SpO2: 98% 98%    Last Pain:  Vitals:   11/16/20 1052  TempSrc:   PainSc: 0-No pain                 Lenard Simmer

## 2020-11-16 NOTE — H&P (Signed)
Wyline Mood, MD [address], [place], [place], [address] [address], [address], [place], [place], [address] Phone: [phone]  Fax: [phone]  Primary Care Physician:  Reubin Milan, MD   Pre-Procedure History & Physical: HPI:  Matthew Matsumura. is a 59 y.o. male is here for an colonoscopy.   Past Medical History:  Diagnosis Date  . Essential hypertension 05/01/2015  . Hypertension     Past Surgical History:  Procedure Laterality Date  . COLONOSCOPY  2011   normal  . ESOPHAGOGASTRODUODENOSCOPY  2013   HH, gastritis, H Pylori (-)  . REPLACEMENT TOTAL KNEE BILATERAL Bilateral 2009    Prior to Admission medications   Medication Sig Start Date End Date Taking? Authorizing Provider  fluticasone (FLONASE) 50 MCG/ACT nasal spray Place 2 sprays into both nostrils daily. 05/01/15  Yes Reubin Milan, MD  gabapentin (NEURONTIN) 100 MG capsule Take 1 capsule by mouth at bedtime 10/02/19  Yes Reubin Milan, MD  Loratadine (CLARITIN PO) Take 1 tablet by mouth as needed.   Yes [provider]  Multiple Vitamin (MULTIVITAMIN) tablet Take 1 tablet by mouth daily.   Yes [provider]  nystatin cream (MYCOSTATIN) Apply 1 application topically 2 (two) times daily. Patient not taking: Reported on 11/16/2020 04/30/20   Duanne Limerick, MD    Allergies as of 10/08/2020 - Review Complete 10/08/2020  Allergen Reaction Noted  . Augmentin [amoxicillin-pot clavulanate] Palpitations 05/01/2015    Family History  Problem Relation Age of Onset  . Diabetes Father   . Pancreatic cancer Father     Social History   Socioeconomic History  . Marital status: Single    Spouse name: Not on file  . Number of children: Not on file  . Years of education: Not on file  . Highest education level: Not on file  Occupational History  . Not on file  Tobacco Use  . Smoking status: Never Smoker  . Smokeless tobacco: Current User    Types: Snuff  Vaping Use   . Vaping Use: Never used  Substance and Sexual Activity  . Alcohol use: No    Alcohol/week: 0.0 standard drinks  . Drug use: No  . Sexual activity: Not on file  Other Topics Concern  . Not on file  Social History Narrative  . Not on file   Social Determinants of Health   Financial Resource Strain: Not on file  Food Insecurity: Not on file  Transportation Needs: Not on file  Physical Activity: Not on file  Stress: Not on file  Social Connections: Not on file  Intimate Partner Violence: Not on file    Review of Systems: See HPI, otherwise negative ROS  Physical Exam: BP (!) 185/95   Pulse 64   Temp 97.7 F (36.5 C) (Temporal)   Resp 20   Ht 6' (1.829 m)   Wt (!) 149.7 kg   SpO2 100%   BMI 44.76 kg/m  General:   Alert,  pleasant and cooperative in NAD Head:  Normocephalic and atraumatic. Neck:  Supple; no masses or thyromegaly. Lungs:  Clear throughout to auscultation, normal respiratory effort.    Heart:  +S1, +S2, Regular rate and rhythm, No edema. Abdomen:  Soft, nontender and nondistended. Normal bowel sounds, without guarding, and without rebound.   Neurologic:  Alert and  oriented x4;  grossly normal neurologically.  Impression/Plan: Matthew Schaefer. is here for an colonoscopy to be performed forfamily history of colon polyps.  Risks, benefits, limitations, and alternatives regarding  colonoscopy have been reviewed with the patient.  Questions have been answered.  All parties agreeable.   Wyline Mood, MD  11/16/2020, 10:01 AM

## 2020-11-16 NOTE — Anesthesia Procedure Notes (Signed)
Procedure Name: General with mask airway Performed by: Fletcher-Harrison, Ysidro Ramsay, CRNA Pre-anesthesia Checklist: Patient identified, Emergency Drugs available, Suction available and Patient being monitored Patient Re-evaluated:Patient Re-evaluated prior to induction Oxygen Delivery Method: Simple face mask Induction Type: IV induction Placement Confirmation: positive ETCO2 and CO2 detector Dental Injury: Teeth and Oropharynx as per pre-operative assessment        

## 2020-11-19 ENCOUNTER — Encounter: Payer: Self-pay | Admitting: Gastroenterology

## 2020-11-19 LAB — SURGICAL PATHOLOGY

## 2021-09-30 ENCOUNTER — Encounter: Payer: Self-pay | Admitting: Internal Medicine

## 2021-09-30 ENCOUNTER — Ambulatory Visit: Payer: Managed Care, Other (non HMO) | Admitting: Internal Medicine

## 2021-09-30 ENCOUNTER — Other Ambulatory Visit: Payer: Self-pay

## 2021-09-30 VITALS — BP 132/82 | HR 72 | Ht 72.0 in | Wt 351.8 lb

## 2021-09-30 DIAGNOSIS — H9203 Otalgia, bilateral: Secondary | ICD-10-CM

## 2021-09-30 DIAGNOSIS — Z125 Encounter for screening for malignant neoplasm of prostate: Secondary | ICD-10-CM | POA: Diagnosis not present

## 2021-09-30 DIAGNOSIS — H6993 Unspecified Eustachian tube disorder, bilateral: Secondary | ICD-10-CM | POA: Diagnosis not present

## 2021-09-30 DIAGNOSIS — Z6841 Body Mass Index (BMI) 40.0 and over, adult: Secondary | ICD-10-CM

## 2021-09-30 DIAGNOSIS — Z0000 Encounter for general adult medical examination without abnormal findings: Secondary | ICD-10-CM | POA: Diagnosis not present

## 2021-09-30 MED ORDER — PREDNISONE 10 MG PO TABS: 10.0000 mg | ORAL_TABLET | ORAL | 0 refills | Status: AC

## 2021-09-30 NOTE — Progress Notes (Signed)
Date:  09/30/2021   Name:  Matthew Schaefer.   DOB:  1961-01-05   MRN:  [id number]   Chief Complaint: Annual Exam Matthew Call. is a 60 y.o. male who presents today for his Complete Annual Exam. He feels fairly well. He reports exercising. He reports he is sleeping well.   Colonoscopy: 11/2020  Immunization History  Administered Date(s) Administered   Tdap 09/05/2013    HPI  Lab Results  Component Value Date   CREATININE 1.05 09/26/2020   BUN 17 09/26/2020   NA 142 09/26/2020   K 4.6 09/26/2020   CL 104 09/26/2020   CO2 22 09/26/2020   Lab Results  Component Value Date   CHOL 173 09/26/2020   HDL 78 09/26/2020   LDLCALC 84 09/26/2020   TRIG 53 09/26/2020   CHOLHDL 2.2 09/26/2020   Lab Results  Component Value Date   TSH 1.150 09/26/2019   Lab Results  Component Value Date   HGBA1C 5.1 09/20/2018   Lab Results  Component Value Date   WBC 3.7 09/26/2020   HGB 16.1 09/26/2020   HCT 47.5 09/26/2020   MCV 87 09/26/2020   PLT 160 09/26/2020   Lab Results  Component Value Date   ALT 19 09/26/2020   AST 27 09/26/2020   ALKPHOS 76 09/26/2020   BILITOT 0.7 09/26/2020   No components found for: VITD  Review of Systems  Constitutional:  Negative for appetite change, chills, diaphoresis, fatigue and unexpected weight change.  HENT:  Positive for ear pain. Negative for hearing loss, tinnitus, trouble swallowing and voice change.   Eyes:  Negative for visual disturbance.  Respiratory:  Negative for choking, shortness of breath and wheezing.   Cardiovascular:  Negative for chest pain, palpitations and leg swelling.  Gastrointestinal:  Negative for abdominal pain, blood in stool, constipation and diarrhea.  Genitourinary:  Negative for difficulty urinating, dysuria and frequency.  Musculoskeletal:  Negative for arthralgias, back pain and myalgias.  Skin:  Negative for color change and rash.  Neurological:  Positive for light-headedness. Negative for dizziness,  syncope and headaches.  Hematological:  Negative for adenopathy.  Psychiatric/Behavioral:  Negative for dysphoric mood and sleep disturbance. The patient is not nervous/anxious.    Patient Active Problem List   Diagnosis Date Noted   BMI 50.0-59.9, adult ([place]) 03/03/2016   Environmental and seasonal allergies 05/01/2015   GERD without esophagitis 05/01/2015   Osteoarthritis of spine 05/01/2015    Allergies  Allergen Reactions   Augmentin [Amoxicillin-Pot Clavulanate] Palpitations    Past Surgical History:  Procedure Laterality Date   COLONOSCOPY  2011   normal   COLONOSCOPY WITH PROPOFOL N/A 11/16/2020   Procedure: COLONOSCOPY WITH PROPOFOL;  Surgeon: Jonathon Bellows, MD;  [facility] ENDOSCOPY;  Service: Gastroenterology;  Laterality: N/A;   ESOPHAGOGASTRODUODENOSCOPY  2013   HH, gastritis, H Pylori (-)   REPLACEMENT TOTAL KNEE BILATERAL Bilateral 2009    Social History   Tobacco Use   Smoking status: Never   Smokeless tobacco: Current    Types: Snuff  Vaping Use   Vaping Use: Never used  Substance Use Topics   Alcohol use: No    Alcohol/week: 0.0 standard drinks   Drug use: No     Medication list has been reviewed and updated.  Current Meds  Medication Sig   fluticasone (FLONASE) 50 MCG/ACT nasal spray Place 2 sprays into both nostrils daily.   gabapentin (NEURONTIN) 100 MG capsule Take 1 capsule by mouth at bedtime  Loratadine (CLARITIN PO) Take 1 tablet by mouth as needed.   Multiple Vitamin (MULTIVITAMIN) tablet Take 1 tablet by mouth daily.   nystatin cream (MYCOSTATIN) Apply 1 application topically 2 (two) times daily.    PHQ 2/9 Scores 09/30/2021 09/26/2020 04/30/2020 09/26/2019  PHQ - 2 Score 0 0 0 0  PHQ- 9 Score 0 0 0 0    GAD 7 : Generalized Anxiety Score 09/30/2021 04/30/2020  Nervous, Anxious, on Edge 0 0  Control/stop worrying 0 0  Worry too much - different things 0 0  Trouble relaxing 0 0  Restless 0 0  Easily annoyed or irritable 0 0   Afraid - awful might happen 0 0  Total GAD 7 Score 0 0  Anxiety Difficulty - Not difficult at all    BP Readings from Last 3 Encounters:  09/30/21 132/82  11/16/20 132/74  09/26/20 132/80    Physical Exam Vitals and nursing note reviewed.  Constitutional:      Appearance: Normal appearance. He is well-developed.  HENT:     Head: Normocephalic.     Right Ear: Tympanic membrane, ear canal and external ear normal.     Left Ear: Tympanic membrane, ear canal and external ear normal.     Nose: Nose normal.  Eyes:     Conjunctiva/sclera: Conjunctivae normal.     Pupils: Pupils are equal, round, and reactive to light.  Neck:     Thyroid: No thyromegaly.     Vascular: No carotid bruit.  Cardiovascular:     Rate and Rhythm: Normal rate and regular rhythm.     Heart sounds: Normal heart sounds.  Pulmonary:     Effort: Pulmonary effort is normal.     Breath sounds: Normal breath sounds. No wheezing.  Chest:  Breasts:    Right: No mass.     Left: No mass.  Abdominal:     General: Bowel sounds are normal.     Palpations: Abdomen is soft.     Tenderness: There is no abdominal tenderness.  Musculoskeletal:        General: Normal range of motion.     Cervical back: Normal range of motion and neck supple.     Right lower leg: No edema.     Left lower leg: No edema.  Lymphadenopathy:     Cervical: No cervical adenopathy.  Skin:    General: Skin is warm and dry.     Capillary Refill: Capillary refill takes less than 2 seconds.  Neurological:     General: No focal deficit present.     Mental Status: He is alert and oriented to person, place, and time.     Deep Tendon Reflexes: Reflexes are normal and symmetric.  Psychiatric:        Attention and Perception: Attention normal.        Mood and Affect: Mood normal.        Thought Content: Thought content normal.    Wt Readings from Last 3 Encounters:  09/30/21 (!) 351 lb 12.8 oz (159.6 kg)  11/16/20 (!) 330 lb (149.7 kg)   09/26/20 (!) 333 lb (151 kg)    BP 132/82   Pulse 72   Ht 6' (1.829 m)   Wt (!) 351 lb 12.8 oz (159.6 kg)   SpO2 96%   BMI 47.71 kg/m   Assessment and Plan: 1. Annual physical exam Exam is normal except for weight. Encourage regular exercise and appropriate dietary changes. He declines flu, shingrix and covid vaccines. -  CBC with Differential/Platelet - Comprehensive metabolic panel - Hemoglobin A1c - Lipid panel  2. Prostate cancer screening DRE deferred - PSA  3. Otalgia of both ears No evidence of infection; congestion on the left  4. Disorder of both eustachian tubes Continue Flonase and Claritin daily; sudafed as needed - predniSONE (DELTASONE) 10 MG tablet; Take 1 tablet (10 mg total) by mouth as directed for 6 days. Take 6,5,4,3,2,1 then stop  Dispense: 21 tablet; Refill: 0  5. BMI 45.0-49.9, adult ([place]) Continue diet and exercise   Partially dictated using Editor, commissioning. Any errors are unintentional.  Halina Maidens, MD [facility]  09/30/2021

## 2021-10-01 LAB — CBC WITH DIFFERENTIAL/PLATELET
Basophils Absolute: 0 10*3/uL (ref 0.0–0.2)
Basos: 1 %
EOS (ABSOLUTE): 0.1 10*3/uL (ref 0.0–0.4)
Eos: 2 %
Hematocrit: 46.5 % (ref 37.5–51.0)
Hemoglobin: 15.8 g/dL (ref 13.0–17.7)
Immature Grans (Abs): 0 10*3/uL (ref 0.0–0.1)
Immature Granulocytes: 0 %
Lymphocytes Absolute: 0.9 10*3/uL (ref 0.7–3.1)
Lymphs: 30 %
MCH: 28.5 pg (ref 26.6–33.0)
MCHC: 34 g/dL (ref 31.5–35.7)
MCV: 84 fL (ref 79–97)
Monocytes Absolute: 0.5 10*3/uL (ref 0.1–0.9)
Monocytes: 17 %
Neutrophils Absolute: 1.6 10*3/uL (ref 1.4–7.0)
Neutrophils: 50 %
Platelets: 160 10*3/uL (ref 150–450)
RBC: 5.55 x10E6/uL (ref 4.14–5.80)
RDW: 11.2 % — ABNORMAL LOW (ref 11.6–15.4)
WBC: 3.1 10*3/uL — ABNORMAL LOW (ref 3.4–10.8)

## 2021-10-01 LAB — LIPID PANEL
Chol/HDL Ratio: 2.4 ratio (ref 0.0–5.0)
Cholesterol, Total: 170 mg/dL (ref 100–199)
HDL: 70 mg/dL (ref >39)
LDL Chol Calc (NIH): 92 mg/dL (ref 0–99)
Triglycerides: 36 mg/dL (ref 0–149)
VLDL Cholesterol Cal: 8 mg/dL (ref 5–40)

## 2021-10-01 LAB — COMPREHENSIVE METABOLIC PANEL
ALT: 19 IU/L (ref 0–44)
AST: 23 IU/L (ref 0–40)
Albumin/Globulin Ratio: 1.5 (ref 1.2–2.2)
Albumin: 4.1 g/dL (ref 3.8–4.8)
Alkaline Phosphatase: 90 IU/L (ref 44–121)
BUN/Creatinine Ratio: 21 (ref 10–24)
BUN: 22 mg/dL (ref 8–27)
Bilirubin Total: 0.4 mg/dL (ref 0.0–1.2)
CO2: 25 mmol/L (ref 20–29)
Calcium: 9 mg/dL (ref 8.6–10.2)
Chloride: 103 mmol/L (ref 96–106)
Creatinine, Ser: 1.04 mg/dL (ref 0.76–1.27)
Globulin, Total: 2.7 g/dL (ref 1.5–4.5)
Glucose: 99 mg/dL (ref 70–99)
Potassium: 4.3 mmol/L (ref 3.5–5.2)
Sodium: 139 mmol/L (ref 134–144)
Total Protein: 6.8 g/dL (ref 6.0–8.5)
eGFR: 81 mL/min/{1.73_m2} (ref >59)

## 2021-10-01 LAB — PSA: Prostate Specific Ag, Serum: 0.4 ng/mL (ref 0.0–4.0)

## 2021-10-01 LAB — HEMOGLOBIN A1C
Est. average glucose Bld gHb Est-mCnc: 111 mg/dL
Hgb A1c MFr Bld: 5.5 % (ref 4.8–5.6)

## 2022-10-06 ENCOUNTER — Encounter: Payer: Self-pay | Admitting: Internal Medicine

## 2022-10-06 ENCOUNTER — Ambulatory Visit (INDEPENDENT_AMBULATORY_CARE_PROVIDER_SITE_OTHER): Payer: Managed Care, Other (non HMO) | Admitting: Internal Medicine

## 2022-10-06 VITALS — BP 148/80 | HR 72 | Ht 72.0 in | Wt 365.6 lb

## 2022-10-06 DIAGNOSIS — Z125 Encounter for screening for malignant neoplasm of prostate: Secondary | ICD-10-CM

## 2022-10-06 DIAGNOSIS — K219 Gastro-esophageal reflux disease without esophagitis: Secondary | ICD-10-CM | POA: Diagnosis not present

## 2022-10-06 DIAGNOSIS — Z13220 Encounter for screening for lipoid disorders: Secondary | ICD-10-CM

## 2022-10-06 DIAGNOSIS — Z6841 Body Mass Index (BMI) 40.0 and over, adult: Secondary | ICD-10-CM

## 2022-10-06 DIAGNOSIS — Z0000 Encounter for general adult medical examination without abnormal findings: Secondary | ICD-10-CM | POA: Diagnosis not present

## 2022-10-06 DIAGNOSIS — Z131 Encounter for screening for diabetes mellitus: Secondary | ICD-10-CM | POA: Diagnosis not present

## 2022-10-06 NOTE — Patient Instructions (Signed)
Goal BP is < 130/80  Check BP at home for the new 2 weeks and follow up with me if more than half the readings are higher than this.

## 2022-10-06 NOTE — Progress Notes (Signed)
Date:  10/06/2022   Name:  Matthew Schaefer.   DOB:  [birth year]   MRN:  [id number]   Chief Complaint: Annual Exam Matthew Schaefer. is a 61 y.o. male who presents today for his Complete Annual Exam. He feels well. He reports exercising. He reports he is sleeping well.   Colonoscopy: 11/2020 repeat 3 yrs  Immunization History  Administered Date(s) Administered   Tdap 09/05/2013   Health Maintenance Due  Topic Date Due   Zoster Vaccines- Shingrix (1 of 2) Never done    Lab Results  Component Value Date   PSA1 0.4 09/30/2021   PSA1 0.4 09/26/2020   PSA1 0.4 09/26/2019   PSA 0.4 09/13/2014     Abdominal Pain This is a new problem. The current episode started 1 to 4 weeks ago. The problem occurs intermittently. The problem has been unchanged. The pain is located in the epigastric region. The patient is experiencing no pain. Quality: feels like a vibration. Pertinent negatives include no arthralgias, constipation, diarrhea, dysuria, frequency, headaches or myalgias. The pain is aggravated by eating. The pain is relieved by Nothing. He has tried nothing for the symptoms.    Lab Results  Component Value Date   NA 139 09/30/2021   K 4.3 09/30/2021   CO2 25 09/30/2021   GLUCOSE 99 09/30/2021   BUN 22 09/30/2021   CREATININE 1.04 09/30/2021   CALCIUM 9.0 09/30/2021   EGFR 81 09/30/2021   GFRNONAA 76 09/26/2020   Lab Results  Component Value Date   CHOL 170 09/30/2021   HDL 70 09/30/2021   LDLCALC 92 09/30/2021   TRIG 36 09/30/2021   CHOLHDL 2.4 09/30/2021   Lab Results  Component Value Date   TSH 1.150 09/26/2019   Lab Results  Component Value Date   HGBA1C 5.5 09/30/2021   Lab Results  Component Value Date   WBC 3.1 (L) 09/30/2021   HGB 15.8 09/30/2021   HCT 46.5 09/30/2021   MCV 84 09/30/2021   PLT 160 09/30/2021   Lab Results  Component Value Date   ALT 19 09/30/2021   AST 23 09/30/2021   ALKPHOS 90 09/30/2021   BILITOT 0.4 09/30/2021   No results  found for: "25OHVITD2", "25OHVITD3", "VD25OH"   Review of Systems  Constitutional:  Negative for appetite change, chills, diaphoresis, fatigue and unexpected weight change.  HENT:  Negative for hearing loss, tinnitus, trouble swallowing and voice change.   Eyes:  Negative for visual disturbance.  Respiratory:  Negative for choking, shortness of breath and wheezing.   Cardiovascular:  Negative for chest pain, palpitations and leg swelling.  Gastrointestinal:  Positive for abdominal pain. Negative for blood in stool, constipation and diarrhea.  Genitourinary:  Negative for difficulty urinating, dysuria and frequency.  Musculoskeletal:  Negative for arthralgias, back pain and myalgias.  Skin:  Negative for color change and rash.  Neurological:  Negative for dizziness, syncope and headaches.  Hematological:  Negative for adenopathy.  Psychiatric/Behavioral:  Negative for dysphoric mood and sleep disturbance. The patient is not nervous/anxious.     Patient Active Problem List   Diagnosis Date Noted   BMI 50.0-59.9, adult ([place]) 03/03/2016   Environmental and seasonal allergies 05/01/2015   GERD without esophagitis 05/01/2015   Osteoarthritis of spine 05/01/2015    Allergies  Allergen Reactions   Augmentin [Amoxicillin-Pot Clavulanate] Palpitations    Past Surgical History:  Procedure Laterality Date   COLONOSCOPY  2011   normal   COLONOSCOPY WITH PROPOFOL N/A 11/16/2020  Procedure: COLONOSCOPY WITH PROPOFOL;  Surgeon: Jonathon Bellows, MD;  Location[facility] ENDOSCOPY;  Service: Gastroenterology;  Laterality: N/A;   ESOPHAGOGASTRODUODENOSCOPY  2013   HH, gastritis, H Pylori (-)   REPLACEMENT TOTAL KNEE BILATERAL Bilateral 2009    Social History   Tobacco Use   Smoking status: Never   Smokeless tobacco: Current    Types: Snuff  Vaping Use   Vaping Use: Never used  Substance Use Topics   Alcohol use: No    Alcohol/week: 0.0 standard drinks of alcohol   Drug use: No      Medication list has been reviewed and updated.  Current Meds  Medication Sig   fluticasone (FLONASE) 50 MCG/ACT nasal spray Place 2 sprays into both nostrils daily.   gabapentin (NEURONTIN) 100 MG capsule Take 1 capsule by mouth at bedtime (Patient taking differently: Take 100 mg by mouth daily as needed.)   Loratadine (CLARITIN PO) Take 1 tablet by mouth as needed.   meloxicam (MOBIC) 15 MG tablet Take 1 tablet every day by oral route with meals.   Multiple Vitamin (MULTIVITAMIN) tablet Take 1 tablet by mouth daily.       10/06/2022    8:26 AM 09/30/2021    8:06 AM 04/30/2020    9:24 AM  GAD 7 : Generalized Anxiety Score  Nervous, Anxious, on Edge 0 0 0  Control/stop worrying 0 0 0  Worry too much - different things 0 0 0  Trouble relaxing 0 0 0  Restless 0 0 0  Easily annoyed or irritable 0 0 0  Afraid - awful might happen 0 0 0  Total GAD 7 Score 0 0 0  Anxiety Difficulty Not difficult at all  Not difficult at all       10/06/2022    8:26 AM 09/30/2021    8:06 AM 09/26/2020    9:00 AM  Depression screen PHQ 2/9  Decreased Interest 0 0 0  Down, Depressed, Hopeless 0 0 0  PHQ - 2 Score 0 0 0  Altered sleeping 0 0 0  Tired, decreased energy 0 0 0  Change in appetite 0 0 0  Feeling bad or failure about yourself  0 0 0  Trouble concentrating 0 0 0  Moving slowly or fidgety/restless 0 0 0  Suicidal thoughts 0 0 0  PHQ-9 Score 0 0 0  Difficult doing work/chores Not difficult at all Not difficult at all     BP Readings from Last 3 Encounters:  10/06/22 (!) 148/80  09/30/21 132/82  11/16/20 132/74    Physical Exam Vitals and nursing note reviewed.  Constitutional:      Appearance: Normal appearance. He is well-developed.  HENT:     Head: Normocephalic.     Right Ear: Tympanic membrane, ear canal and external ear normal.     Left Ear: Tympanic membrane, ear canal and external ear normal.     Nose: Nose normal.  Eyes:     Conjunctiva/sclera: Conjunctivae  normal.     Pupils: Pupils are equal, round, and reactive to light.  Neck:     Thyroid: No thyromegaly.     Vascular: No carotid bruit.  Cardiovascular:     Rate and Rhythm: Normal rate and regular rhythm.     Heart sounds: Normal heart sounds.  Pulmonary:     Effort: Pulmonary effort is normal.     Breath sounds: Normal breath sounds. No wheezing.  Chest:  Breasts:    Right: No mass.  Left: No mass.  Abdominal:     General: Bowel sounds are normal.     Palpations: Abdomen is soft. There is no mass.     Tenderness: There is no abdominal tenderness. There is no guarding or rebound.  Musculoskeletal:        General: Normal range of motion.     Cervical back: Normal range of motion and neck supple.     Right lower leg: No edema.     Left lower leg: No edema.  Lymphadenopathy:     Cervical: No cervical adenopathy.  Skin:    General: Skin is warm and dry.     Capillary Refill: Capillary refill takes less than 2 seconds.  Neurological:     General: No focal deficit present.     Mental Status: He is alert and oriented to person, place, and time.     Deep Tendon Reflexes: Reflexes are normal and symmetric.  Psychiatric:        Attention and Perception: Attention normal.        Mood and Affect: Mood normal.        Thought Content: Thought content normal.     Wt Readings from Last 3 Encounters:  10/06/22 (!) 365 lb 9.6 oz (165.8 kg)  09/30/21 (!) 351 lb 12.8 oz (159.6 kg)  11/16/20 (!) 330 lb (149.7 kg)    BP (!) 148/80   Pulse 72   Ht 6' (1.829 m)   Wt (!) 365 lb 9.6 oz (165.8 kg)   SpO2 95%   BMI 49.58 kg/m   Assessment and Plan: 1. Annual physical exam Exam is normal except for weight. Encourage regular exercise and appropriate dietary changes. His home scales say he has lost 30 lbs - he is disappointed but will continue healthy diet and exercise; calibrate home scales. BP is elevated - he will monitor at home and return if > 130/80 - CBC with  Differential/Platelet - Comprehensive metabolic panel - Hemoglobin A1c - Lipid panel - PSA  2. Prostate cancer screening DRE deferred - PSA  3. GERD without esophagitis Mild epigastric symptoms could be gall bladder related Return if worsening. - CBC with Differential/Platelet  4. Screening for diabetes mellitus - Comprehensive metabolic panel - Hemoglobin A1c  5. Screening for lipid disorders - Lipid panel  6. BMI 45.0-49.9, adult ([place]) Continue dietary and exercise efforts  Return for Nurse visit for Shingles vaccine.  Partially dictated using Editor, commissioning. Any errors are unintentional.  Halina Maidens, MD [facility]  10/06/2022

## 2022-10-07 ENCOUNTER — Encounter: Payer: Self-pay | Admitting: Internal Medicine

## 2022-10-07 DIAGNOSIS — R7303 Prediabetes: Secondary | ICD-10-CM | POA: Insufficient documentation

## 2022-10-07 LAB — COMPREHENSIVE METABOLIC PANEL
ALT: 19 IU/L (ref 0–44)
AST: 21 IU/L (ref 0–40)
Albumin/Globulin Ratio: 1.6 (ref 1.2–2.2)
Albumin: 3.9 g/dL (ref 3.9–4.9)
Alkaline Phosphatase: 86 IU/L (ref 44–121)
BUN/Creatinine Ratio: 17 (ref 10–24)
BUN: 15 mg/dL (ref 8–27)
Bilirubin Total: 0.4 mg/dL (ref 0.0–1.2)
CO2: 22 mmol/L (ref 20–29)
Calcium: 8.7 mg/dL (ref 8.6–10.2)
Chloride: 105 mmol/L (ref 96–106)
Creatinine, Ser: 0.9 mg/dL (ref 0.76–1.27)
Globulin, Total: 2.5 g/dL (ref 1.5–4.5)
Glucose: 102 mg/dL — ABNORMAL HIGH (ref 70–99)
Potassium: 4.3 mmol/L (ref 3.5–5.2)
Sodium: 139 mmol/L (ref 134–144)
Total Protein: 6.4 g/dL (ref 6.0–8.5)
eGFR: 96 mL/min/{1.73_m2} (ref >59)

## 2022-10-07 LAB — CBC WITH DIFFERENTIAL/PLATELET
Basophils Absolute: 0 10*3/uL (ref 0.0–0.2)
Basos: 1 %
EOS (ABSOLUTE): 0.1 10*3/uL (ref 0.0–0.4)
Eos: 4 %
Hematocrit: 44.4 % (ref 37.5–51.0)
Hemoglobin: 15.5 g/dL (ref 13.0–17.7)
Immature Grans (Abs): 0 10*3/uL (ref 0.0–0.1)
Immature Granulocytes: 0 %
Lymphocytes Absolute: 1.2 10*3/uL (ref 0.7–3.1)
Lymphs: 42 %
MCH: 28.9 pg (ref 26.6–33.0)
MCHC: 34.9 g/dL (ref 31.5–35.7)
MCV: 83 fL (ref 79–97)
Monocytes Absolute: 0.4 10*3/uL (ref 0.1–0.9)
Monocytes: 15 %
Neutrophils Absolute: 1.1 10*3/uL — ABNORMAL LOW (ref 1.4–7.0)
Neutrophils: 38 %
Platelets: 157 10*3/uL (ref 150–450)
RBC: 5.36 x10E6/uL (ref 4.14–5.80)
RDW: 11.4 % — ABNORMAL LOW (ref 11.6–15.4)
WBC: 2.8 10*3/uL — ABNORMAL LOW (ref 3.4–10.8)

## 2022-10-07 LAB — LIPID PANEL
Chol/HDL Ratio: 2.3 ratio (ref 0.0–5.0)
Cholesterol, Total: 180 mg/dL (ref 100–199)
HDL: 79 mg/dL (ref >39)
LDL Chol Calc (NIH): 91 mg/dL (ref 0–99)
Triglycerides: 51 mg/dL (ref 0–149)
VLDL Cholesterol Cal: 10 mg/dL (ref 5–40)

## 2022-10-07 LAB — PSA: Prostate Specific Ag, Serum: 0.4 ng/mL (ref 0.0–4.0)

## 2022-10-07 LAB — HEMOGLOBIN A1C
Est. average glucose Bld gHb Est-mCnc: 126 mg/dL
Hgb A1c MFr Bld: 6 % — ABNORMAL HIGH (ref 4.8–5.6)

## 2022-10-20 ENCOUNTER — Ambulatory Visit: Payer: Self-pay | Admitting: *Deleted

## 2022-10-20 ENCOUNTER — Encounter: Payer: Self-pay | Admitting: Internal Medicine

## 2022-10-20 ENCOUNTER — Ambulatory Visit: Payer: Managed Care, Other (non HMO) | Admitting: Internal Medicine

## 2022-10-20 VITALS — BP 144/86 | HR 71 | Ht 72.0 in | Wt 357.0 lb

## 2022-10-20 DIAGNOSIS — H6993 Unspecified Eustachian tube disorder, bilateral: Secondary | ICD-10-CM | POA: Diagnosis not present

## 2022-10-20 DIAGNOSIS — J0100 Acute maxillary sinusitis, unspecified: Secondary | ICD-10-CM

## 2022-10-20 MED ORDER — CEFDINIR 300 MG PO CAPS: 300.0000 mg | ORAL_CAPSULE | Freq: Two times a day (BID) | ORAL | 0 refills | Status: AC

## 2022-10-20 MED ORDER — PREDNISONE 10 MG PO TABS: ORAL_TABLET | ORAL | 0 refills | Status: AC

## 2022-10-20 NOTE — Progress Notes (Signed)
Date:  10/20/2022   Name:  Matthew Schaefer.   DOB:  08-17-1961   MRN:  [id number]   Chief Complaint: Dizziness (Patient said he has motion sickness. He said he does not have dizziness but only "vertigo." Michela Pitcher it started last Tuesday, 6 days ago.)  Dizziness This is a new problem. Episode onset: onset 6 days ago. The problem occurs constantly. The problem has been waxing and waning. Associated symptoms include congestion and vertigo. Pertinent negatives include no abdominal pain, change in bowel habit, chest pain, chills, coughing, fatigue, fever, headaches, myalgias, rash, swollen glands, vomiting or weakness. Treatments tried: sudafed, flonase, netipot.    Lab Results  Component Value Date   NA 139 10/06/2022   K 4.3 10/06/2022   CO2 22 10/06/2022   GLUCOSE 102 (H) 10/06/2022   BUN 15 10/06/2022   CREATININE 0.90 10/06/2022   CALCIUM 8.7 10/06/2022   EGFR 96 10/06/2022   GFRNONAA 76 09/26/2020   Lab Results  Component Value Date   CHOL 180 10/06/2022   HDL 79 10/06/2022   LDLCALC 91 10/06/2022   TRIG 51 10/06/2022   CHOLHDL 2.3 10/06/2022   Lab Results  Component Value Date   TSH 1.150 09/26/2019   Lab Results  Component Value Date   HGBA1C 6.0 (H) 10/06/2022   Lab Results  Component Value Date   WBC 2.8 (L) 10/06/2022   HGB 15.5 10/06/2022   HCT 44.4 10/06/2022   MCV 83 10/06/2022   PLT 157 10/06/2022   Lab Results  Component Value Date   ALT 19 10/06/2022   AST 21 10/06/2022   ALKPHOS 86 10/06/2022   BILITOT 0.4 10/06/2022   No results found for: "25OHVITD2", "25OHVITD3", "VD25OH"   Review of Systems  Constitutional:  Negative for chills, fatigue and fever.  HENT:  Positive for congestion, sinus pressure and sinus pain. Negative for trouble swallowing.   Respiratory:  Negative for cough and shortness of breath.   Cardiovascular:  Negative for chest pain and leg swelling.  Gastrointestinal:  Negative for abdominal pain, change in bowel habit and  vomiting.  Musculoskeletal:  Negative for myalgias.  Skin:  Negative for rash.  Neurological:  Positive for dizziness and vertigo. Negative for weakness, light-headedness and headaches.  Psychiatric/Behavioral:  Negative for dysphoric mood and sleep disturbance. The patient is not nervous/anxious.     Patient Active Problem List   Diagnosis Date Noted   Prediabetes 10/07/2022   BMI 50.0-59.9, adult ([place]) 03/03/2016   Environmental and seasonal allergies 05/01/2015   GERD without esophagitis 05/01/2015   Osteoarthritis of spine 05/01/2015    Allergies  Allergen Reactions   Augmentin [Amoxicillin-Pot Clavulanate] Palpitations    Past Surgical History:  Procedure Laterality Date   COLONOSCOPY  2011   normal   COLONOSCOPY WITH PROPOFOL N/A 11/16/2020   Procedure: COLONOSCOPY WITH PROPOFOL;  Surgeon: Jonathon Bellows, MD;  Location: [facility] ENDOSCOPY;  Service: Gastroenterology;  Laterality: N/A;   ESOPHAGOGASTRODUODENOSCOPY  2013   HH, gastritis, H Pylori (-)   REPLACEMENT TOTAL KNEE BILATERAL Bilateral 2009    Social History   Tobacco Use   Smoking status: Never   Smokeless tobacco: Current    Types: Snuff  Vaping Use   Vaping Use: Never used  Substance Use Topics   Alcohol use: No    Alcohol/week: 0.0 standard drinks of alcohol   Drug use: No     Medication list has been reviewed and updated.  Current Meds  Medication Sig   fluticasone (  FLONASE) 50 MCG/ACT nasal spray Place 2 sprays into both nostrils daily.   gabapentin (NEURONTIN) 100 MG capsule Take 1 capsule by mouth at bedtime (Patient taking differently: Take 100 mg by mouth daily as needed.)   Loratadine (CLARITIN PO) Take 1 tablet by mouth as needed.   meloxicam (MOBIC) 15 MG tablet Take 1 tablet every day by oral route with meals.   Multiple Vitamin (MULTIVITAMIN) tablet Take 1 tablet by mouth daily.   cefdinir (OMNICEF) 300 MG capsule Take 1 capsule (300 mg total) by mouth 2 (two) times daily for 10 days.    predniSONE (DELTASONE) 10 MG tablet Take 6 tablets (60 mg total) by mouth daily with breakfast for 1 day, THEN 5 tablets (50 mg total) daily with breakfast for 1 day, THEN 4 tablets (40 mg total) daily with breakfast for 1 day, THEN 3 tablets (30 mg total) daily with breakfast for 1 day, THEN 2 tablets (20 mg total) daily with breakfast for 1 day, THEN 1 tablet (10 mg total) daily with breakfast for 1 day.       10/20/2022    3:24 PM 10/06/2022    8:26 AM 09/30/2021    8:06 AM 04/30/2020    9:24 AM  GAD 7 : Generalized Anxiety Score  Nervous, Anxious, on Edge 0 0 0 0  Control/stop worrying 0 0 0 0  Worry too much - different things 0 0 0 0  Trouble relaxing 0 0 0 0  Restless 0 0 0 0  Easily annoyed or irritable 0 0 0 0  Afraid - awful might happen 0 0 0 0  Total GAD 7 Score 0 0 0 0  Anxiety Difficulty Not difficult at all Not difficult at all  Not difficult at all       10/20/2022    3:23 PM 10/06/2022    8:26 AM 09/30/2021    8:06 AM  Depression screen PHQ 2/9  Decreased Interest 0 0 0  Down, Depressed, Hopeless 0 0 0  PHQ - 2 Score 0 0 0  Altered sleeping 0 0 0  Tired, decreased energy 0 0 0  Change in appetite 0 0 0  Feeling bad or failure about yourself  0 0 0  Trouble concentrating 0 0 0  Moving slowly or fidgety/restless 0 0 0  Suicidal thoughts 0 0 0  PHQ-9 Score 0 0 0  Difficult doing work/chores Not difficult at all Not difficult at all Not difficult at all    BP Readings from Last 3 Encounters:  10/20/22 (!) 144/86  10/06/22 (!) 148/80  09/30/21 132/82    Physical Exam Constitutional:      Appearance: Normal appearance. He is well-developed.  HENT:     Right Ear: Ear canal and external ear normal. No middle ear effusion. Tympanic membrane is retracted. Tympanic membrane is not erythematous.     Left Ear: Ear canal and external ear normal.  No middle ear effusion. Tympanic membrane is retracted. Tympanic membrane is not erythematous.     Nose:     Right  Sinus: No maxillary sinus tenderness or frontal sinus tenderness.     Left Sinus: Maxillary sinus tenderness and frontal sinus tenderness present.     Mouth/Throat:     Mouth: No oral lesions.     Pharynx: Uvula midline. No oropharyngeal exudate or posterior oropharyngeal erythema.  Cardiovascular:     Rate and Rhythm: Normal rate and regular rhythm.     Heart sounds: Normal heart sounds.  Pulmonary:     Breath sounds: Normal breath sounds. No wheezing or rales.  Musculoskeletal:     Cervical back: Normal range of motion.  Lymphadenopathy:     Cervical: No cervical adenopathy.  Neurological:     Mental Status: He is alert and oriented to person, place, and time.     Wt Readings from Last 3 Encounters:  10/20/22 (!) 357 lb (161.9 kg)  10/06/22 (!) 365 lb 9.6 oz (165.8 kg)  09/30/21 (!) 351 lb 12.8 oz (159.6 kg)    BP (!) 144/86 (BP Location: Right Arm, Cuff Size: Large)   Pulse 71   Ht 6' (1.829 m)   Wt (!) 357 lb (161.9 kg)   SpO2 96%   BMI 48.42 kg/m   Assessment and Plan: 1. Acute non-recurrent maxillary sinusitis Continue Flonase and nasal rinses Decrease sudafed due to elevated BP and monitor - cefdinir (OMNICEF) 300 MG capsule; Take 1 capsule (300 mg total) by mouth 2 (two) times daily for 10 days.  Dispense: 20 capsule; Refill: 0  2. Disorder of both eustachian tubes Steroid taper - predniSONE (DELTASONE) 10 MG tablet; Take 6 tablets (60 mg total) by mouth daily with breakfast for 1 day, THEN 5 tablets (50 mg total) daily with breakfast for 1 day, THEN 4 tablets (40 mg total) daily with breakfast for 1 day, THEN 3 tablets (30 mg total) daily with breakfast for 1 day, THEN 2 tablets (20 mg total) daily with breakfast for 1 day, THEN 1 tablet (10 mg total) daily with breakfast for 1 day.  Dispense: 21 tablet; Refill: 0  Follow up in 2 months to recheck BP.  Continue weight loss efforts.  Partially dictated using Editor, commissioning. Any errors are unintentional.  Halina Maidens, MD [facility]  10/20/2022

## 2022-10-20 NOTE — Telephone Encounter (Signed)
  Chief Complaint: vertigo and congestion Symptoms: left ear clogged more than right. Congestion no cough no runny nose . Reports "feels like I am moving in space when I am not". Dizziness when bending over. Frequency: na Pertinent Negatives: Patient denies lightheadedness no dizziness , no cold sx no headache no blurred vision no double vision  Disposition: [] ED /[] Urgent Care (no appt availability in office) / [x] Appointment(In office/virtual)/ []  [facility]/ [] Home Care/ [] Refused Recommended Disposition /[] [facility]/ []  Follow-up with PCP Additional Notes:   Appt today     Reason for Disposition  Earache  Answer Assessment - Initial Assessment Questions 1. DESCRIPTION: "Describe your dizziness."     "Fills like moving in space when still." 2. VERTIGO: "Do you feel like either you or the room is spinning or tilting?"      Denies  3. LIGHTHEADED: "Do you feel lightheaded?" (e.g., somewhat faint, woozy, weak upon standing)     Bending down , vertigo when moving  4. SEVERITY: "How bad is it?"  "Can you walk?"   - MILD: Feels slightly dizzy and unsteady, but is walking normally.   - MODERATE: Feels unsteady when walking, but not falling; interferes with normal activities (e.g., school, work).   - SEVERE: Unable to walk without falling, or requires assistance to walk without falling.     Can walk, no issues with balance  5. ONSET:  "When did the dizziness begin?"     na 6. AGGRAVATING FACTORS: "Does anything make it worse?" (e.g., standing, change in head position)     Bending over 7. CAUSE: "What do you think is causing the dizziness?"     congestion 8. RECURRENT SYMPTOM: "Have you had dizziness before?" If Yes, ask: "When was the last time?" "What happened that time?"     na 9. OTHER SYMPTOMS: "Do you have any other symptoms?" (e.g., headache, weakness, numbness, vomiting, earache)     Sinus pressure behind eyes can hear left more clogged feeling than  right  10. PREGNANCY: "Is there any chance you are pregnant?" "When was your last menstrual period?"       na  Protocols used: Dizziness - Vertigo-A-AH

## 2022-11-17 ENCOUNTER — Ambulatory Visit (INDEPENDENT_AMBULATORY_CARE_PROVIDER_SITE_OTHER): Payer: Managed Care, Other (non HMO) | Admitting: Internal Medicine

## 2022-11-17 ENCOUNTER — Telehealth: Payer: Self-pay | Admitting: Internal Medicine

## 2022-11-17 ENCOUNTER — Encounter: Payer: Self-pay | Admitting: Internal Medicine

## 2022-11-17 VITALS — BP 174/82 | HR 71 | Ht 72.0 in | Wt 362.2 lb

## 2022-11-17 DIAGNOSIS — R002 Palpitations: Secondary | ICD-10-CM

## 2022-11-17 DIAGNOSIS — I10 Essential (primary) hypertension: Secondary | ICD-10-CM

## 2022-11-17 MED ORDER — LISINOPRIL 10 MG PO TABS: 10.0000 mg | ORAL_TABLET | Freq: Every day | ORAL | 1 refills | Status: DC

## 2022-11-17 NOTE — Progress Notes (Signed)
Date:  11/17/2022   Name:  Matthew Schaefer.   DOB:  28-Mar-1961   MRN:  [id number]   Chief Complaint: Hypertension  Hypertension This is a new problem. The current episode started more than 1 month ago. The problem has been gradually worsening since onset. The problem is uncontrolled. Associated symptoms include neck pain (and muscle spasm) and palpitations (he only notices these when he has his left arm up over his head). Pertinent negatives include no blurred vision, chest pain, headaches, peripheral edema, shortness of breath or sweats. Risk factors for coronary artery disease include obesity. Past treatments include nothing.    Lab Results  Component Value Date   NA 139 10/06/2022   K 4.3 10/06/2022   CO2 22 10/06/2022   GLUCOSE 102 (H) 10/06/2022   BUN 15 10/06/2022   CREATININE 0.90 10/06/2022   CALCIUM 8.7 10/06/2022   EGFR 96 10/06/2022   GFRNONAA 76 09/26/2020   Lab Results  Component Value Date   CHOL 180 10/06/2022   HDL 79 10/06/2022   LDLCALC 91 10/06/2022   TRIG 51 10/06/2022   CHOLHDL 2.3 10/06/2022   Lab Results  Component Value Date   TSH 1.150 09/26/2019   Lab Results  Component Value Date   HGBA1C 6.0 (H) 10/06/2022   Lab Results  Component Value Date   WBC 2.8 (L) 10/06/2022   HGB 15.5 10/06/2022   HCT 44.4 10/06/2022   MCV 83 10/06/2022   PLT 157 10/06/2022   Lab Results  Component Value Date   ALT 19 10/06/2022   AST 21 10/06/2022   ALKPHOS 86 10/06/2022   BILITOT 0.4 10/06/2022   No results found for: "25OHVITD2", "25OHVITD3", "VD25OH"   Review of Systems  Constitutional:  Negative for chills, fatigue and unexpected weight change.  HENT:  Negative for nosebleeds.   Eyes:  Negative for blurred vision and visual disturbance.  Respiratory:  Negative for cough, chest tightness, shortness of breath and wheezing.   Cardiovascular:  Positive for palpitations (he only notices these when he has his left arm up over his head). Negative for  chest pain and leg swelling.  Gastrointestinal:  Negative for abdominal pain, constipation and diarrhea.  Musculoskeletal:  Positive for neck pain (and muscle spasm).  Neurological:  Negative for dizziness, weakness, light-headedness and headaches.    Patient Active Problem List   Diagnosis Date Noted   Prediabetes 10/07/2022   BMI 50.0-59.9, adult ([place]) 03/03/2016   Essential hypertension 05/01/2015   Environmental and seasonal allergies 05/01/2015   GERD without esophagitis 05/01/2015   Osteoarthritis of spine 05/01/2015    Allergies  Allergen Reactions   Augmentin [Amoxicillin-Pot Clavulanate] Palpitations    Past Surgical History:  Procedure Laterality Date   COLONOSCOPY  2011   normal   COLONOSCOPY WITH PROPOFOL N/A 11/16/2020   Procedure: COLONOSCOPY WITH PROPOFOL;  Surgeon: Jonathon Bellows, MD;  Location: [facility] ENDOSCOPY;  Service: Gastroenterology;  Laterality: N/A;   ESOPHAGOGASTRODUODENOSCOPY  2013   HH, gastritis, H Pylori (-)   REPLACEMENT TOTAL KNEE BILATERAL Bilateral 2009    Social History   Tobacco Use   Smoking status: Never   Smokeless tobacco: Current    Types: Snuff  Vaping Use   Vaping Use: Never used  Substance Use Topics   Alcohol use: No    Alcohol/week: 0.0 standard drinks of alcohol   Drug use: No     Medication list has been reviewed and updated.  Current Meds  Medication Sig   fluticasone (  FLONASE) 50 MCG/ACT nasal spray Place 2 sprays into both nostrils daily.   gabapentin (NEURONTIN) 100 MG capsule Take 1 capsule by mouth at bedtime (Patient taking differently: Take 100 mg by mouth daily as needed.)   lisinopril (ZESTRIL) 10 MG tablet Take 1 tablet (10 mg total) by mouth daily.   Loratadine (CLARITIN PO) Take 1 tablet by mouth as needed.   meloxicam (MOBIC) 15 MG tablet Take 1 tablet every day by oral route with meals.   Multiple Vitamin (MULTIVITAMIN) tablet Take 1 tablet by mouth daily.       11/17/2022    2:57 PM 10/20/2022    3:24  PM 10/06/2022    8:26 AM 09/30/2021    8:06 AM  GAD 7 : Generalized Anxiety Score  Nervous, Anxious, on Edge 2 0 0 0  Control/stop worrying 0 0 0 0  Worry too much - different things 0 0 0 0  Trouble relaxing 0 0 0 0  Restless 0 0 0 0  Easily annoyed or irritable 0 0 0 0  Afraid - awful might happen 0 0 0 0  Total GAD 7 Score 2 0 0 0  Anxiety Difficulty Not difficult at all Not difficult at all Not difficult at all        11/17/2022    2:57 PM 10/20/2022    3:23 PM 10/06/2022    8:26 AM  Depression screen PHQ 2/9  Decreased Interest 0 0 0  Down, Depressed, Hopeless 0 0 0  PHQ - 2 Score 0 0 0  Altered sleeping 0 0 0  Tired, decreased energy 0 0 0  Change in appetite 0 0 0  Feeling bad or failure about yourself  0 0 0  Trouble concentrating 0 0 0  Moving slowly or fidgety/restless 0 0 0  Suicidal thoughts 0 0 0  PHQ-9 Score 0 0 0  Difficult doing work/chores Not difficult at all Not difficult at all Not difficult at all    BP Readings from Last 3 Encounters:  11/17/22 (!) 174/82  10/20/22 (!) 144/86  10/06/22 (!) 148/80    Physical Exam Vitals and nursing note reviewed.  Constitutional:      General: He is not in acute distress.    Appearance: He is well-developed.  HENT:     Head: Normocephalic and atraumatic.  Cardiovascular:     Rate and Rhythm: Normal rate and regular rhythm. Occasional Extrasystoles are present.    Pulses: Normal pulses.  Pulmonary:     Effort: Pulmonary effort is normal. No respiratory distress.     Breath sounds: No wheezing or rhonchi.  Musculoskeletal:     Cervical back: Normal range of motion.  Skin:    General: Skin is warm and dry.     Findings: No rash.  Neurological:     Mental Status: He is alert and oriented to person, place, and time.  Psychiatric:        Mood and Affect: Mood normal.        Behavior: Behavior normal.     Wt Readings from Last 3 Encounters:  11/17/22 (!) 362 lb 3.2 oz (164.3 kg)  10/20/22 (!) 357 lb  (161.9 kg)  10/06/22 (!) 365 lb 9.6 oz (165.8 kg)    BP (!) 174/82   Pulse 71   Ht 6' (1.829 m)   Wt (!) 362 lb 3.2 oz (164.3 kg)   SpO2 97%   BMI 49.12 kg/m   Assessment and Plan: Problem List Items  Addressed This Visit       Cardiovascular and Mediastinum   Essential hypertension - Primary (Chronic)    Possible Hx of HTN in the past - previously on HCTZ and lisinopril BP at home for the past 2 weeks > 130/80 He feels well. Will start lisinopril and recheck in one month EKG shows LVH by voltage and a single PVC      Relevant Medications   lisinopril (ZESTRIL) 10 MG tablet   Other Relevant Orders   EKG 12-Lead (Completed)   Other Visit Diagnoses     Heart palpitations       single PVC noted on EKG otherwise normal Will monitor for worsening -pt has not tolerated beta blockers in the past        Partially dictated using Animal nutritionist. Any errors are unintentional.  Bari Edward, MD [facility]  11/17/2022

## 2022-11-17 NOTE — Telephone Encounter (Signed)
Copied from CRM #[id number]. Topic: General - Other >> Nov 17, 2022  9:59 AM [place] B wrote: Reason for CRM: Patient called in just to let Dr Berglund know is BP is 140/90 

## 2022-11-17 NOTE — Assessment & Plan Note (Addendum)
Possible Hx of HTN in the past - previously on HCTZ and lisinopril BP at home for the past 2 weeks > 130/80 He feels well. Will start lisinopril and recheck in one month EKG shows LVH by voltage and a single PVC

## 2022-11-17 NOTE — Telephone Encounter (Signed)
Called pt scheduled an appointment.  KP 

## 2022-11-17 NOTE — Telephone Encounter (Signed)
Copied from [place] [id number]. Topic: General - Other >> Nov 17, 2022  9:59 AM [place] B wrote: Reason for CRM: Patient called in just to let Dr Army Melia know is BP is 140/90

## 2022-11-24 ENCOUNTER — Telehealth: Payer: Self-pay | Admitting: Internal Medicine

## 2022-11-24 ENCOUNTER — Telehealth: Payer: Self-pay

## 2022-11-24 NOTE — Telephone Encounter (Signed)
Spoke with patient. Ordered sleep study per DOT physician because patient is obese, and has hypertension.      11/24/2022    2:05 PM  Results of the Epworth flowsheet  Sitting and reading 0  Watching TV 2  Sitting, inactive in a public place (e.g. a theatre or a meeting) 0  As a passenger in a car for an hour without a break 0  Lying down to rest in the afternoon when circumstances permit 0  Sitting and talking to someone 0  Sitting quietly after a lunch without alcohol 1  In a car, while stopped for a few minutes in traffic 0  Total score 3

## 2022-11-24 NOTE — Telephone Encounter (Signed)
Copied from [place] [id number]. Topic: General - Inquiry >> Nov 24, 2022  1:49 PM Rosanne Ashing P wrote: Reason for CRM: Pt called saying he just had a DOT PE and they said he needed to get a sleep apnea test.  He wants to know if Dr, Army Melia can order the test for him.  He would prefer a home test.  CB#  [phone]

## 2022-11-24 NOTE — Telephone Encounter (Signed)
Spoke with patient because he had a DOT physical this morning and is now requesting an at home sleep study. Completed Epworth Scale. Patient scored a 3.   Per the DOT physical physician, patient had Obesity, and Hypertension so needs a sleep study.      11/24/2022    2:05 PM  Results of the Epworth flowsheet  Sitting and reading 0  Watching TV 2  Sitting, inactive in a public place (e.g. a theatre or a meeting) 0  As a passenger in a car for an hour without a break 0  Lying down to rest in the afternoon when circumstances permit 0  Sitting and talking to someone 0  Sitting quietly after a lunch without alcohol 1  In a car, while stopped for a few minutes in traffic 0  Total score 3     Sent order to [facility] for patient. And told him someone should give him a call.  Joi Leyva

## 2022-12-22 ENCOUNTER — Encounter: Payer: Self-pay | Admitting: Internal Medicine

## 2022-12-22 ENCOUNTER — Ambulatory Visit: Payer: Managed Care, Other (non HMO) | Admitting: Internal Medicine

## 2022-12-22 VITALS — BP 129/80 | HR 95 | Ht 72.0 in | Wt 356.4 lb

## 2022-12-22 DIAGNOSIS — Z6841 Body Mass Index (BMI) 40.0 and over, adult: Secondary | ICD-10-CM | POA: Diagnosis not present

## 2022-12-22 DIAGNOSIS — I10 Essential (primary) hypertension: Secondary | ICD-10-CM

## 2022-12-22 NOTE — Assessment & Plan Note (Addendum)
BP more elevated recently so lisinopril started last month He continues on regular exercise and low sodium diet Bp well controlled today - continue current regimen Will obtain results of sleep study if possible

## 2022-12-22 NOTE — Progress Notes (Signed)
Date:  12/22/2022   Name:  Matthew Schaefer.   DOB:  1961-10-08   MRN:  [id number]   Chief Complaint: Hypertension  Hypertension This is a recurrent problem. The current episode started more than 1 month ago. The problem has been gradually improving since onset. The problem is controlled. Pertinent negatives include no chest pain, headaches, palpitations, peripheral edema or shortness of breath. There are no associated agents to hypertension. Past treatments include ACE inhibitors (lisinopril started last month).   He had his DOT exam and they ordered a home sleep study.  He is waiting to hear the results.  Lab Results  Component Value Date   NA 139 10/06/2022   K 4.3 10/06/2022   CO2 22 10/06/2022   GLUCOSE 102 (H) 10/06/2022   BUN 15 10/06/2022   CREATININE 0.90 10/06/2022   CALCIUM 8.7 10/06/2022   EGFR 96 10/06/2022   GFRNONAA 76 09/26/2020   Lab Results  Component Value Date   CHOL 180 10/06/2022   HDL 79 10/06/2022   LDLCALC 91 10/06/2022   TRIG 51 10/06/2022   CHOLHDL 2.3 10/06/2022   Lab Results  Component Value Date   TSH 1.150 09/26/2019   Lab Results  Component Value Date   HGBA1C 6.0 (H) 10/06/2022   Lab Results  Component Value Date   WBC 2.8 (L) 10/06/2022   HGB 15.5 10/06/2022   HCT 44.4 10/06/2022   MCV 83 10/06/2022   PLT 157 10/06/2022   Lab Results  Component Value Date   ALT 19 10/06/2022   AST 21 10/06/2022   ALKPHOS 86 10/06/2022   BILITOT 0.4 10/06/2022   No results found for: "25OHVITD2", "25OHVITD3", "VD25OH"   Review of Systems  Constitutional:  Negative for fatigue and unexpected weight change.  HENT:  Negative for nosebleeds.   Eyes:  Negative for visual disturbance.  Respiratory:  Negative for cough, chest tightness, shortness of breath and wheezing.   Cardiovascular:  Negative for chest pain, palpitations and leg swelling.  Gastrointestinal:  Negative for abdominal pain, constipation and diarrhea.  Neurological:  Negative  for dizziness, weakness, light-headedness and headaches.    Patient Active Problem List   Diagnosis Date Noted   Prediabetes 10/07/2022   BMI 50.0-59.9, adult ([place]) 03/03/2016   Essential hypertension 05/01/2015   Environmental and seasonal allergies 05/01/2015   GERD without esophagitis 05/01/2015   Osteoarthritis of spine 05/01/2015    Allergies  Allergen Reactions   Augmentin [Amoxicillin-Pot Clavulanate] Palpitations    Past Surgical History:  Procedure Laterality Date   COLONOSCOPY  2011   normal   COLONOSCOPY WITH PROPOFOL N/A 11/16/2020   Procedure: COLONOSCOPY WITH PROPOFOL;  Surgeon: Jonathon Bellows, MD;  Location: [facility] ENDOSCOPY;  Service: Gastroenterology;  Laterality: N/A;   ESOPHAGOGASTRODUODENOSCOPY  2013   HH, gastritis, H Pylori (-)   REPLACEMENT TOTAL KNEE BILATERAL Bilateral 2009    Social History   Tobacco Use   Smoking status: Never   Smokeless tobacco: Current    Types: Snuff  Vaping Use   Vaping Use: Never used  Substance Use Topics   Alcohol use: No    Alcohol/week: 0.0 standard drinks of alcohol   Drug use: No     Medication list has been reviewed and updated.  Current Meds  Medication Sig   fluticasone (FLONASE) 50 MCG/ACT nasal spray Place 2 sprays into both nostrils daily.   gabapentin (NEURONTIN) 100 MG capsule Take 1 capsule by mouth at bedtime (Patient taking differently: Take 100 mg  by mouth daily as needed.)   lisinopril (ZESTRIL) 10 MG tablet Take 1 tablet (10 mg total) by mouth daily.   Loratadine (CLARITIN PO) Take 1 tablet by mouth as needed.   Multiple Vitamin (MULTIVITAMIN) tablet Take 1 tablet by mouth daily.   [DISCONTINUED] meloxicam (MOBIC) 15 MG tablet Take 1 tablet every day by oral route with meals.       12/22/2022    3:21 PM 11/17/2022    2:57 PM 10/20/2022    3:24 PM 10/06/2022    8:26 AM  GAD 7 : Generalized Anxiety Score  Nervous, Anxious, on Edge 1 2 0 0  Control/stop worrying 1 0 0 0  Worry too much -  different things 0 0 0 0  Trouble relaxing 1 0 0 0  Restless 0 0 0 0  Easily annoyed or irritable 0 0 0 0  Afraid - awful might happen 0 0 0 0  Total GAD 7 Score 3 2 0 0  Anxiety Difficulty Not difficult at all Not difficult at all Not difficult at all Not difficult at all       12/22/2022    3:21 PM 11/17/2022    2:57 PM 10/20/2022    3:23 PM  Depression screen PHQ 2/9  Decreased Interest 0 0 0  Down, Depressed, Hopeless 0 0 0  PHQ - 2 Score 0 0 0  Altered sleeping 0 0 0  Tired, decreased energy 0 0 0  Change in appetite 0 0 0  Feeling bad or failure about yourself  0 0 0  Trouble concentrating 0 0 0  Moving slowly or fidgety/restless 0 0 0  Suicidal thoughts 0 0 0  PHQ-9 Score 0 0 0  Difficult doing work/chores Not difficult at all Not difficult at all Not difficult at all    BP Readings from Last 3 Encounters:  12/22/22 129/80  11/17/22 (!) 174/82  10/20/22 (!) 144/86    Physical Exam Vitals and nursing note reviewed.  Constitutional:      General: He is not in acute distress.    Appearance: He is well-developed.  HENT:     Head: Normocephalic and atraumatic.  Cardiovascular:     Rate and Rhythm: Normal rate and regular rhythm.  Pulmonary:     Effort: Pulmonary effort is normal. No respiratory distress.     Breath sounds: No wheezing or rhonchi.  Musculoskeletal:     Cervical back: Normal range of motion.     Right lower leg: No edema.     Left lower leg: No edema.  Lymphadenopathy:     Cervical: No cervical adenopathy.  Skin:    General: Skin is warm and dry.     Findings: No rash.  Neurological:     Mental Status: He is alert and oriented to person, place, and time.  Psychiatric:        Mood and Affect: Mood normal.        Behavior: Behavior normal.       12/22/2022    3:40 PM 11/24/2022    2:05 PM  Results of the Epworth flowsheet  Sitting and reading 0 0  Watching TV 0 2  Sitting, inactive in a public place (e.g. a theatre or a meeting) 0 0  As  a passenger in a car for an hour without a break 0 0  Lying down to rest in the afternoon when circumstances permit 1 0  Sitting and talking to someone 0 0  Sitting quietly  after a lunch without alcohol 1 1  In a car, while stopped for a few minutes in traffic 0 0  Total score 2 3     Wt Readings from Last 3 Encounters:  12/22/22 (!) 356 lb 6.4 oz (161.7 kg)  11/17/22 (!) 362 lb 3.2 oz (164.3 kg)  10/20/22 (!) 357 lb (161.9 kg)    BP 129/80 (BP Location: Right Arm, Cuff Size: Large)   Pulse 95   Ht 6' (1.829 m)   Wt (!) 356 lb 6.4 oz (161.7 kg)   SpO2 96%   BMI 48.34 kg/m   Assessment and Plan: Problem List Items Addressed This Visit       Cardiovascular and Mediastinum   Essential hypertension - Primary (Chronic)    BP more elevated recently so lisinopril started last month He continues on regular exercise and low sodium diet Bp well controlled today - continue current regimen Will obtain results of sleep study if possible        Other   BMI 50.0-59.9, adult ([place])    Continue work on diet and exercise Epworth scale low at 2      Other Visit Diagnoses     BMI 45.0-49.9, adult ([place])   (Chronic)          Partially dictated using Editor, commissioning. Any errors are unintentional.  Halina Maidens, MD [facility]  12/22/2022

## 2022-12-22 NOTE — Assessment & Plan Note (Signed)
Continue work on diet and exercise Epworth scale low at 2

## 2022-12-23 ENCOUNTER — Ambulatory Visit: Payer: Managed Care, Other (non HMO) | Admitting: Internal Medicine

## 2023-01-12 ENCOUNTER — Telehealth: Payer: Self-pay | Admitting: Internal Medicine

## 2023-01-12 NOTE — Telephone Encounter (Signed)
Copied from [place] [id number]. Topic: General - Other >> Jan 12, 2023  3:05 PM [place] C wrote: Reason for CRM: The patient has called to check on the status of their results from their sleep study conducted at the end of January   The patient would like to be contacted regarding their results when possible

## 2023-01-13 ENCOUNTER — Encounter: Payer: Self-pay | Admitting: Internal Medicine

## 2023-01-13 NOTE — Telephone Encounter (Signed)
Spoke with patient and informed. He has not heard anything about his cpap equipment yet. I will refax the CPAP order with sleep study.  Matthew Schaefer

## 2023-02-16 ENCOUNTER — Telehealth: Payer: Self-pay | Admitting: Internal Medicine

## 2023-02-16 NOTE — Telephone Encounter (Signed)
Copied from CRM [id number]. Topic: General - Other >> Feb 16, 2023 10:49 AM Matthew Schaefer A wrote: Reason for CRM: Pt is calling requesting to speak with his PCP. Per pt he is wanting  his medication lisinopril (ZESTRIL) 10 MG tablet changed. Per pt he is still having issues with his BP being 150/80 and some days the bottom number ranges from 80-90.  Please advise

## 2023-02-19 ENCOUNTER — Encounter: Payer: Self-pay | Admitting: Internal Medicine

## 2023-02-19 ENCOUNTER — Ambulatory Visit: Payer: Managed Care, Other (non HMO) | Admitting: Internal Medicine

## 2023-02-19 VITALS — BP 141/70 | HR 65 | Ht 72.0 in | Wt 348.0 lb

## 2023-02-19 DIAGNOSIS — I10 Essential (primary) hypertension: Secondary | ICD-10-CM

## 2023-02-19 DIAGNOSIS — G4733 Obstructive sleep apnea (adult) (pediatric): Secondary | ICD-10-CM | POA: Diagnosis not present

## 2023-02-19 NOTE — Assessment & Plan Note (Addendum)
Clinically stable exam with fairly well controlled BP on lisinopril 10 mg.   Tolerating medications without side effects. Will increase dose to 20 mg.

## 2023-02-19 NOTE — Assessment & Plan Note (Signed)
Doing well currently Usage about 6 hours per night, 24/24 nights so far He will return in 5 weeks to document for insurance

## 2023-02-19 NOTE — Progress Notes (Signed)
Date:  02/19/2023   Name:  Matthew NakayamaFrank Olejniczak Jr.   DOB:  Oct 04, 1961   MRN:  [id number]   Chief Complaint: Hypertension and Sleep Apnea  Hypertension This is a new problem. The problem is controlled. Pertinent negatives include no chest pain, headaches, palpitations or shortness of breath. Past treatments include ACE inhibitors. The current treatment provides significant improvement.   OSA - mild-mod on recent testing initiated by DOT examiner.  CPAP ordered 12/2022. He is tolerating it well for the past few weeks.  He falls asleep easier and sleeps more deeply.   Lab Results  Component Value Date   NA 139 10/06/2022   K 4.3 10/06/2022   CO2 22 10/06/2022   GLUCOSE 102 (H) 10/06/2022   BUN 15 10/06/2022   CREATININE 0.90 10/06/2022   CALCIUM 8.7 10/06/2022   EGFR 96 10/06/2022   GFRNONAA 76 09/26/2020   Lab Results  Component Value Date   CHOL 180 10/06/2022   HDL 79 10/06/2022   LDLCALC 91 10/06/2022   TRIG 51 10/06/2022   CHOLHDL 2.3 10/06/2022   Lab Results  Component Value Date   TSH 1.150 09/26/2019   Lab Results  Component Value Date   HGBA1C 6.0 (H) 10/06/2022   Lab Results  Component Value Date   WBC 2.8 (L) 10/06/2022   HGB 15.5 10/06/2022   HCT 44.4 10/06/2022   MCV 83 10/06/2022   PLT 157 10/06/2022   Lab Results  Component Value Date   ALT 19 10/06/2022   AST 21 10/06/2022   ALKPHOS 86 10/06/2022   BILITOT 0.4 10/06/2022   No results found for: "25OHVITD2", "25OHVITD3", "VD25OH"   Review of Systems  Constitutional:  Negative for fatigue and unexpected weight change.  HENT:  Negative for nosebleeds.   Eyes:  Negative for visual disturbance.  Respiratory:  Negative for cough, chest tightness, shortness of breath and wheezing.   Cardiovascular:  Negative for chest pain, palpitations and leg swelling.  Gastrointestinal:  Negative for abdominal pain, constipation and diarrhea.  Neurological:  Negative for dizziness, weakness, light-headedness and  headaches.    Patient Active Problem List   Diagnosis Date Noted   OSA (obstructive sleep apnea) 02/19/2023   Prediabetes 10/07/2022   BMI 50.0-59.9, adult 03/03/2016   Essential hypertension 05/01/2015   Environmental and seasonal allergies 05/01/2015   GERD without esophagitis 05/01/2015   Osteoarthritis of spine 05/01/2015    Allergies  Allergen Reactions   Augmentin [Amoxicillin-Pot Clavulanate] Palpitations    Past Surgical History:  Procedure Laterality Date   COLONOSCOPY  2011   normal   COLONOSCOPY WITH PROPOFOL N/A 11/16/2020   Procedure: COLONOSCOPY WITH PROPOFOL;  Surgeon: Wyline MoodAnna, Kiran, MD;  Location: [facility] ENDOSCOPY;  Service: Gastroenterology;  Laterality: N/A;   ESOPHAGOGASTRODUODENOSCOPY  2013   HH, gastritis, H Pylori (-)   REPLACEMENT TOTAL KNEE BILATERAL Bilateral 2009    Social History   Tobacco Use   Smoking status: Never   Smokeless tobacco: Current    Types: Snuff  Vaping Use   Vaping Use: Never used  Substance Use Topics   Alcohol use: No    Alcohol/week: 0.0 standard drinks of alcohol   Drug use: No     Medication list has been reviewed and updated.  Current Meds  Medication Sig   fluticasone (FLONASE) 50 MCG/ACT nasal spray Place 2 sprays into both nostrils daily.   gabapentin (NEURONTIN) 100 MG capsule Take 1 capsule by mouth at bedtime (Patient taking differently: Take 100 mg by  mouth daily as needed.)   lisinopril (ZESTRIL) 10 MG tablet Take 1 tablet (10 mg total) by mouth daily.   Loratadine (CLARITIN PO) Take 1 tablet by mouth as needed.   Multiple Vitamin (MULTIVITAMIN) tablet Take 1 tablet by mouth daily.       02/19/2023    8:13 AM 12/22/2022    3:21 PM 11/17/2022    2:57 PM 10/20/2022    3:24 PM  GAD 7 : Generalized Anxiety Score  Nervous, Anxious, on Edge 1 1 2  0  Control/stop worrying 1 1 0 0  Worry too much - different things 1 0 0 0  Trouble relaxing 1 1 0 0  Restless 0 0 0 0  Easily annoyed or irritable 0 0 0 0   Afraid - awful might happen 1 0 0 0  Total GAD 7 Score 5 3 2  0  Anxiety Difficulty Somewhat difficult Not difficult at all Not difficult at all Not difficult at all       02/19/2023    8:13 AM 12/22/2022    3:21 PM 11/17/2022    2:57 PM  Depression screen PHQ 2/9  Decreased Interest 0 0 0  Down, Depressed, Hopeless 0 0 0  PHQ - 2 Score 0 0 0  Altered sleeping 0 0 0  Tired, decreased energy 0 0 0  Change in appetite 0 0 0  Feeling bad or failure about yourself  0 0 0  Trouble concentrating 0 0 0  Moving slowly or fidgety/restless 0 0 0  Suicidal thoughts 0 0 0  PHQ-9 Score 0 0 0  Difficult doing work/chores Not difficult at all Not difficult at all Not difficult at all    BP Readings from Last 3 Encounters:  02/19/23 (!) 141/70  12/22/22 129/80  11/17/22 (!) 174/82    Physical Exam Vitals and nursing note reviewed.  Constitutional:      General: He is not in acute distress.    Appearance: He is well-developed.  HENT:     Head: Normocephalic and atraumatic.  Cardiovascular:     Rate and Rhythm: Normal rate and regular rhythm.  Pulmonary:     Effort: Pulmonary effort is normal. No respiratory distress.     Breath sounds: No wheezing or rhonchi.  Musculoskeletal:     Cervical back: Normal range of motion.     Right lower leg: No edema.     Left lower leg: No edema.  Skin:    General: Skin is warm and dry.     Findings: No rash.  Neurological:     Mental Status: He is alert and oriented to person, place, and time.  Psychiatric:        Mood and Affect: Mood normal.        Behavior: Behavior normal.     Wt Readings from Last 3 Encounters:  02/19/23 (!) 348 lb (157.9 kg)  12/22/22 (!) 356 lb 6.4 oz (161.7 kg)  11/17/22 (!) 362 lb 3.2 oz (164.3 kg)    BP (!) 141/70 (BP Location: Left Arm, Cuff Size: Large)   Pulse 65   Ht 6' (1.829 m)   Wt (!) 348 lb (157.9 kg)   SpO2 97%   BMI 47.20 kg/m   Assessment and Plan:  Problem List Items Addressed This Visit        Cardiovascular and Mediastinum   Essential hypertension - Primary (Chronic)    Clinically stable exam with fairly well controlled BP on lisinopril 10 mg.  Tolerating medications without side effects. Will increase dose to 20 mg.       Relevant Orders   Basic metabolic panel     Respiratory   OSA (obstructive sleep apnea)    Doing well currently Usage about 6 hours per night, 24/24 nights so far He will return in 5 weeks to document for insurance       Return in about 5 weeks (around 03/26/2023) for HTN, OSA.   Partially dictated using Dragon software, any errors are not intentional.  Reubin Milan, MD [facility] Primary Care and Sports Medicine [place], [place]

## 2023-02-20 LAB — BASIC METABOLIC PANEL
BUN/Creatinine Ratio: 20 (ref 10–24)
BUN: 18 mg/dL (ref 8–27)
CO2: 21 mmol/L (ref 20–29)
Calcium: 9.1 mg/dL (ref 8.6–10.2)
Chloride: 101 mmol/L (ref 96–106)
Creatinine, Ser: 0.92 mg/dL (ref 0.76–1.27)
Glucose: 100 mg/dL — ABNORMAL HIGH (ref 70–99)
Potassium: 4.5 mmol/L (ref 3.5–5.2)
Sodium: 138 mmol/L (ref 134–144)
eGFR: 93 mL/min/{1.73_m2} (ref >59)

## 2023-03-27 ENCOUNTER — Ambulatory Visit: Payer: Managed Care, Other (non HMO) | Admitting: Internal Medicine

## 2023-03-27 ENCOUNTER — Encounter: Payer: Self-pay | Admitting: Internal Medicine

## 2023-03-27 VITALS — BP 128/78 | HR 72 | Ht 72.0 in | Wt 355.0 lb

## 2023-03-27 DIAGNOSIS — I10 Essential (primary) hypertension: Secondary | ICD-10-CM

## 2023-03-27 DIAGNOSIS — M47894 Other spondylosis, thoracic region: Secondary | ICD-10-CM

## 2023-03-27 DIAGNOSIS — G4733 Obstructive sleep apnea (adult) (pediatric): Secondary | ICD-10-CM | POA: Diagnosis not present

## 2023-03-27 DIAGNOSIS — Z6841 Body Mass Index (BMI) 40.0 and over, adult: Secondary | ICD-10-CM

## 2023-03-27 MED ORDER — GABAPENTIN 100 MG PO CAPS: 100.0000 mg | ORAL_CAPSULE | Freq: Every day | ORAL | 0 refills | Status: DC | PRN

## 2023-03-27 MED ORDER — LISINOPRIL 20 MG PO TABS
20.0000 mg | ORAL_TABLET | Freq: Every day | ORAL | 1 refills | Status: DC
Start: 2023-03-27 — End: 2023-10-12

## 2023-03-27 NOTE — Assessment & Plan Note (Signed)
BP improved with increase in lisinopril to 20 mg. Will continue along with reduced sodium diet and exercise.

## 2023-03-27 NOTE — Progress Notes (Signed)
Date:  03/27/2023   Name:  Matthew Schaefer.   DOB:  [birth year]   MRN:  [id number]   Chief Complaint: Hypertension (BP this morning with home cuff was 153/87) and Sleep Apnea  Hypertension This is a chronic problem. The problem has been gradually improving since onset. Associated symptoms include neck pain. Pertinent negatives include no chest pain, headaches, palpitations or shortness of breath. Past treatments include ACE inhibitors.  Neck Pain  This is a recurrent problem. The problem occurs intermittently. The pain is present in the midline. The quality of the pain is described as cramping and shooting. The pain is mild. Pertinent negatives include no chest pain, headaches or weakness. Treatments tried: gabapentin as needed.   OSA - started on CPAP in March.  Doing well last visit but had only been using it for 24 nights.  He is here today to follow up. Usage: 30/30 nights Hours per night: 6.5 avg AHI: 0.47/hr Mask leak: minimal.  Lab Results  Component Value Date   NA 138 02/19/2023   K 4.5 02/19/2023   CO2 21 02/19/2023   GLUCOSE 100 (H) 02/19/2023   BUN 18 02/19/2023   CREATININE 0.92 02/19/2023   CALCIUM 9.1 02/19/2023   EGFR 93 02/19/2023   GFRNONAA 76 09/26/2020   Lab Results  Component Value Date   CHOL 180 10/06/2022   HDL 79 10/06/2022   LDLCALC 91 10/06/2022   TRIG 51 10/06/2022   CHOLHDL 2.3 10/06/2022   Lab Results  Component Value Date   TSH 1.150 09/26/2019   Lab Results  Component Value Date   HGBA1C 6.0 (H) 10/06/2022   Lab Results  Component Value Date   WBC 2.8 (L) 10/06/2022   HGB 15.5 10/06/2022   HCT 44.4 10/06/2022   MCV 83 10/06/2022   PLT 157 10/06/2022   Lab Results  Component Value Date   ALT 19 10/06/2022   AST 21 10/06/2022   ALKPHOS 86 10/06/2022   BILITOT 0.4 10/06/2022   No results found for: "25OHVITD2", "25OHVITD3", "VD25OH"   Review of Systems  Constitutional:  Negative for fatigue and unexpected weight change.   HENT:  Negative for nosebleeds.   Eyes:  Negative for visual disturbance.  Respiratory:  Negative for cough, chest tightness, shortness of breath and wheezing.   Cardiovascular:  Negative for chest pain, palpitations and leg swelling.  Gastrointestinal:  Negative for abdominal pain, constipation and diarrhea.  Musculoskeletal:  Positive for neck pain.  Neurological:  Negative for dizziness, weakness, light-headedness and headaches.  Psychiatric/Behavioral:  Negative for dysphoric mood and sleep disturbance. The patient is not nervous/anxious.     Patient Active Problem List   Diagnosis Date Noted   OSA (obstructive sleep apnea) 02/19/2023   Prediabetes 10/07/2022   BMI 50.0-59.9, adult (HCC) 03/03/2016   Essential hypertension 05/01/2015   Environmental and seasonal allergies 05/01/2015   GERD without esophagitis 05/01/2015   Osteoarthritis of spine 05/01/2015    Allergies  Allergen Reactions   Augmentin [Amoxicillin-Pot Clavulanate] Palpitations    Past Surgical History:  Procedure Laterality Date   COLONOSCOPY  2011   normal   COLONOSCOPY WITH PROPOFOL N/A 11/16/2020   Procedure: COLONOSCOPY WITH PROPOFOL;  Surgeon: Wyline Mood, MD;  Location: [facility] ENDOSCOPY;  Service: Gastroenterology;  Laterality: N/A;   ESOPHAGOGASTRODUODENOSCOPY  2013   HH, gastritis, H Pylori (-)   REPLACEMENT TOTAL KNEE BILATERAL Bilateral 2009    Social History   Tobacco Use   Smoking status: Never   Smokeless  tobacco: Current    Types: Snuff  Vaping Use   Vaping Use: Never used  Substance Use Topics   Alcohol use: No    Alcohol/week: 0.0 standard drinks of alcohol   Drug use: No     Medication list has been reviewed and updated.  Current Meds  Medication Sig   fluticasone (FLONASE) 50 MCG/ACT nasal spray Place 2 sprays into both nostrils daily.   Loratadine (CLARITIN PO) Take 1 tablet by mouth as needed.   Multiple Vitamin (MULTIVITAMIN) tablet Take 1 tablet by mouth daily.    [DISCONTINUED] gabapentin (NEURONTIN) 100 MG capsule Take 1 capsule by mouth at bedtime (Patient taking differently: Take 100 mg by mouth daily as needed.)   [DISCONTINUED] lisinopril (ZESTRIL) 10 MG tablet Take 1 tablet (10 mg total) by mouth daily.       03/27/2023   10:17 AM 02/19/2023    8:13 AM 12/22/2022    3:21 PM 11/17/2022    2:57 PM  GAD 7 : Generalized Anxiety Score  Nervous, Anxious, on Edge 1 1 1 2   Control/stop worrying 0 1 1 0  Worry too much - different things 0 1 0 0  Trouble relaxing 0 1 1 0  Restless 0 0 0 0  Easily annoyed or irritable 0 0 0 0  Afraid - awful might happen 1 1 0 0  Total GAD 7 Score 2 5 3 2   Anxiety Difficulty Not difficult at all Somewhat difficult Not difficult at all Not difficult at all       03/27/2023   10:17 AM 02/19/2023    8:13 AM 12/22/2022    3:21 PM  Depression screen PHQ 2/9  Decreased Interest 0 0 0  Down, Depressed, Hopeless 0 0 0  PHQ - 2 Score 0 0 0  Altered sleeping 0 0 0  Tired, decreased energy 1 0 0  Change in appetite 0 0 0  Feeling bad or failure about yourself  0 0 0  Trouble concentrating 0 0 0  Moving slowly or fidgety/restless 0 0 0  Suicidal thoughts 0 0 0  PHQ-9 Score 1 0 0  Difficult doing work/chores Not difficult at all Not difficult at all Not difficult at all    BP Readings from Last 3 Encounters:  03/27/23 128/78  02/19/23 (!) 141/70  12/22/22 129/80    Physical Exam Vitals and nursing note reviewed.  Constitutional:      General: He is not in acute distress.    Appearance: Normal appearance. He is well-developed. He is obese.  HENT:     Head: Normocephalic and atraumatic.  Cardiovascular:     Rate and Rhythm: Normal rate and regular rhythm.  Pulmonary:     Effort: Pulmonary effort is normal. No respiratory distress.     Breath sounds: No wheezing or rhonchi.  Musculoskeletal:     Cervical back: Normal range of motion.     Right lower leg: No edema.     Left lower leg: No edema.   Lymphadenopathy:     Cervical: No cervical adenopathy.  Skin:    General: Skin is warm and dry.     Findings: No rash.  Neurological:     General: No focal deficit present.     Mental Status: He is alert and oriented to person, place, and time.  Psychiatric:        Mood and Affect: Mood normal.        Behavior: Behavior normal.     Wt  Readings from Last 3 Encounters:  03/27/23 (!) 355 lb (161 kg)  02/19/23 (!) 348 lb (157.9 kg)  12/22/22 (!) 356 lb 6.4 oz (161.7 kg)    BP 128/78 (BP Location: Left Arm, Cuff Size: Large)   Pulse 72   Ht 6' (1.829 m)   Wt (!) 355 lb (161 kg)   SpO2 99%   BMI 48.15 kg/m   Assessment and Plan:  Problem List Items Addressed This Visit     Essential hypertension - Primary (Chronic)    BP improved with increase in lisinopril to 20 mg. Will continue along with reduced sodium diet and exercise.      Relevant Medications   lisinopril (ZESTRIL) 20 MG tablet   Osteoarthritis of spine   Relevant Medications   gabapentin (NEURONTIN) 100 MG capsule   BMI 50.0-59.9, adult (HCC)    He is interested in weight loss medications We discussed options - compounded Semaglutide from [employer] may be his best option      OSA (obstructive sleep apnea)    Doing well.  Here for follow up to document compliance. Usage 30/30 nights Average use 6.5 hours Minimal mask leak AHI 0.47/hr        No follow-ups on file.   Partially dictated using Dragon software, any errors are not intentional.  Reubin Milan, MD [facility] Primary Care and Sports Medicine [place], [place]

## 2023-03-27 NOTE — Assessment & Plan Note (Addendum)
Doing well.  Here for follow up to document compliance. Usage 30/30 nights Average use 6.5 hours Minimal mask leak AHI 0.47/hr

## 2023-03-27 NOTE — Assessment & Plan Note (Signed)
He is interested in weight loss medications We discussed options - compounded Semaglutide from [employer] may be his best option

## 2023-04-07 ENCOUNTER — Telehealth: Payer: Self-pay | Admitting: Internal Medicine

## 2023-04-07 NOTE — Telephone Encounter (Signed)
Copied from CRM [id number]. Topic: General - Other >> Apr 07, 2023 11:00 AM Everette C wrote: Reason for CRM: The patient has called back, as directed, to share that their insurance will cover Ozempic   Please contact the patient further if needed

## 2023-04-08 NOTE — Telephone Encounter (Signed)
Called and left VM informing patient since he is not a diabetic, he cannot get Ozempic.

## 2023-04-09 ENCOUNTER — Encounter: Payer: Self-pay | Admitting: Internal Medicine

## 2023-04-22 ENCOUNTER — Ambulatory Visit: Payer: Managed Care, Other (non HMO) | Admitting: Internal Medicine

## 2023-05-11 ENCOUNTER — Encounter: Payer: Self-pay | Admitting: Internal Medicine

## 2023-06-15 ENCOUNTER — Ambulatory Visit: Payer: Managed Care, Other (non HMO) | Admitting: Internal Medicine

## 2023-06-15 ENCOUNTER — Encounter: Payer: Self-pay | Admitting: Internal Medicine

## 2023-06-15 VITALS — BP 122/78 | HR 92 | Ht 72.0 in | Wt 349.0 lb

## 2023-06-15 DIAGNOSIS — Z6841 Body Mass Index (BMI) 40.0 and over, adult: Secondary | ICD-10-CM

## 2023-06-15 DIAGNOSIS — I10 Essential (primary) hypertension: Secondary | ICD-10-CM | POA: Diagnosis not present

## 2023-06-15 NOTE — Progress Notes (Signed)
Date:  06/15/2023   Name:  Matthew Schaefer.   DOB:  1961-09-30   MRN:  [id number]   Chief Complaint: Weight Check Weight management - pt is here for weight management follow up.  Started on Semaglutide on 05/12/23.  Doing well without side effects.  Starting weight 355 lbs.   Today's weight 349 lbs.  Current diet is low carb and current exercise routine is 4 days per week in his home gym.  Hypertension This is a chronic problem. The problem is controlled. Pertinent negatives include no chest pain, headaches, palpitations or shortness of breath. Past treatments include ACE inhibitors.    Lab Results  Component Value Date   NA 138 02/19/2023   K 4.5 02/19/2023   CO2 21 02/19/2023   GLUCOSE 100 (H) 02/19/2023   BUN 18 02/19/2023   CREATININE 0.92 02/19/2023   CALCIUM 9.1 02/19/2023   EGFR 93 02/19/2023   GFRNONAA 76 09/26/2020   Lab Results  Component Value Date   CHOL 180 10/06/2022   HDL 79 10/06/2022   LDLCALC 91 10/06/2022   TRIG 51 10/06/2022   CHOLHDL 2.3 10/06/2022   Lab Results  Component Value Date   TSH 1.150 09/26/2019   Lab Results  Component Value Date   HGBA1C 6.0 (H) 10/06/2022   Lab Results  Component Value Date   WBC 2.8 (L) 10/06/2022   HGB 15.5 10/06/2022   HCT 44.4 10/06/2022   MCV 83 10/06/2022   PLT 157 10/06/2022   Lab Results  Component Value Date   ALT 19 10/06/2022   AST 21 10/06/2022   ALKPHOS 86 10/06/2022   BILITOT 0.4 10/06/2022   No results found for: "25OHVITD2", "25OHVITD3", "VD25OH"   Review of Systems  Constitutional:  Negative for fatigue and unexpected weight change.  Eyes:  Negative for visual disturbance.  Respiratory:  Negative for cough, chest tightness, shortness of breath and wheezing.   Cardiovascular:  Negative for chest pain, palpitations and leg swelling.  Gastrointestinal:  Negative for abdominal pain, constipation and diarrhea.  Neurological:  Negative for dizziness, weakness, light-headedness and headaches.     Patient Active Problem List   Diagnosis Date Noted   OSA (obstructive sleep apnea) 02/19/2023   Prediabetes 10/07/2022   BMI 50.0-59.9, adult (HCC) 03/03/2016   Essential hypertension 05/01/2015   Environmental and seasonal allergies 05/01/2015   GERD without esophagitis 05/01/2015   Osteoarthritis of spine 05/01/2015    Allergies  Allergen Reactions   Augmentin [Amoxicillin-Pot Clavulanate] Palpitations    Past Surgical History:  Procedure Laterality Date   COLONOSCOPY  2011   normal   COLONOSCOPY WITH PROPOFOL N/A 11/16/2020   Procedure: COLONOSCOPY WITH PROPOFOL;  Surgeon: Wyline Mood, MD;  Location: [facility] ENDOSCOPY;  Service: Gastroenterology;  Laterality: N/A;   ESOPHAGOGASTRODUODENOSCOPY  2013   HH, gastritis, H Pylori (-)   REPLACEMENT TOTAL KNEE BILATERAL Bilateral 2009    Social History   Tobacco Use   Smoking status: Never   Smokeless tobacco: Current    Types: Snuff  Vaping Use   Vaping status: Never Used  Substance Use Topics   Alcohol use: No    Alcohol/week: 0.0 standard drinks of alcohol   Drug use: No     Medication list has been reviewed and updated.  Current Meds  Medication Sig   fluticasone (FLONASE) 50 MCG/ACT nasal spray Place 2 sprays into both nostrils daily.   gabapentin (NEURONTIN) 100 MG capsule Take 1 capsule (100 mg total) by mouth daily  as needed.   lisinopril (ZESTRIL) 20 MG tablet Take 1 tablet (20 mg total) by mouth daily.   Loratadine (CLARITIN PO) Take 1 tablet by mouth as needed.   Multiple Vitamin (MULTIVITAMIN) tablet Take 1 tablet by mouth daily.   Semaglutide, 1 MG/DOSE, 2 MG/1.5ML SOPN Inject into the skin. [facility]       06/15/2023    2:56 PM 03/27/2023   10:17 AM 02/19/2023    8:13 AM 12/22/2022    3:21 PM  GAD 7 : Generalized Anxiety Score  Nervous, Anxious, on Edge 0 1 1 1   Control/stop worrying 0 0 1 1  Worry too much - different things 0 0 1 0  Trouble relaxing 0 0 1 1  Restless 0 0 0 0  Easily annoyed  or irritable 0 0 0 0  Afraid - awful might happen 0 1 1 0  Total GAD 7 Score 0 2 5 3   Anxiety Difficulty Not difficult at all Not difficult at all Somewhat difficult Not difficult at all       06/15/2023    2:56 PM 03/27/2023   10:17 AM 02/19/2023    8:13 AM  Depression screen PHQ 2/9  Decreased Interest 0 0 0  Down, Depressed, Hopeless 0 0 0  PHQ - 2 Score 0 0 0  Altered sleeping 0 0 0  Tired, decreased energy 0 1 0  Change in appetite 0 0 0  Feeling bad or failure about yourself  0 0 0  Trouble concentrating 0 0 0  Moving slowly or fidgety/restless 0 0 0  Suicidal thoughts 0 0 0  PHQ-9 Score 0 1 0  Difficult doing work/chores Not difficult at all Not difficult at all Not difficult at all    BP Readings from Last 3 Encounters:  06/15/23 122/78  03/27/23 128/78  02/19/23 (!) 141/70    Physical Exam Vitals and nursing note reviewed.  Constitutional:      General: He is not in acute distress.    Appearance: He is well-developed.  HENT:     Head: Normocephalic and atraumatic.  Cardiovascular:     Rate and Rhythm: Normal rate and regular rhythm.  Pulmonary:     Effort: Pulmonary effort is normal. No respiratory distress.  Skin:    General: Skin is warm and dry.     Findings: No rash.  Neurological:     Mental Status: He is alert and oriented to person, place, and time.  Psychiatric:        Mood and Affect: Mood normal.        Behavior: Behavior normal.     Wt Readings from Last 3 Encounters:  06/15/23 (!) 349 lb (158.3 kg)  03/27/23 (!) 355 lb (161 kg)  02/19/23 (!) 348 lb (157.9 kg)    BP 122/78   Pulse 92   Ht 6' (1.829 m)   Wt (!) 349 lb (158.3 kg)   SpO2 97%   BMI 47.33 kg/m   Assessment and Plan:  Problem List Items Addressed This Visit       Unprioritized   Essential hypertension - Primary (Chronic)    Normal exam with stable BP on lisinopril. No concerns or side effects to current medication. No change in regimen; continue low sodium  diet.       BMI 50.0-59.9, adult [facility])    Recently started compounded Semaglutide from [employer].  Currently on the ramp up phase of 0.25 mg weekly He is doing well - only  side effect is mild constipation. He will take 0.5 mg weekly for the next 2 weeks then start new Rx for 1 mg weekly. Will send in new Rx for 1 mg dose.      Relevant Medications   Semaglutide, 1 MG/DOSE, 2 MG/1.5ML SOPN    No follow-ups on file.    Reubin Milan, MD [facility] Primary Care and Sports Medicine [place]

## 2023-06-15 NOTE — Assessment & Plan Note (Addendum)
Recently started compounded Semaglutide from [employer].  Currently on the ramp up phase of 0.25 mg weekly He is doing well - only side effect is mild constipation. He will take 0.5 mg weekly for the next 2 weeks then start new Rx for 1 mg weekly. Will send in new Rx for 1 mg dose.

## 2023-06-15 NOTE — Assessment & Plan Note (Signed)
Normal exam with stable BP on lisinopril. No concerns or side effects to current medication. No change in regimen; continue low sodium diet.

## 2023-06-16 ENCOUNTER — Telehealth: Payer: Self-pay | Admitting: Internal Medicine

## 2023-06-16 NOTE — Telephone Encounter (Signed)
Spoke with Dois Davenport and clarified that patient is currently on 20 units and will take this for two weeks and then increase dose to 40 units when he picks up new Rx. Dois Davenport and patient verbalized understanding.

## 2023-06-16 NOTE — Telephone Encounter (Signed)
Copied from CRM [id number]. Topic: General - Other >> Jun 15, 2023  5:33 PM Everette C wrote: Reason for CRM: Dois Davenport with [facility] has called to follow up on a recently received refill request of Semaglutide, 1 MG/DOSE, 2 MG/1.5ML SOPN [[id number]]  Please contact further when possible

## 2023-06-16 NOTE — Telephone Encounter (Signed)
Called [facility] and left VM for a call back to discuss patient Rx.

## 2023-07-20 ENCOUNTER — Encounter: Payer: Self-pay | Admitting: Internal Medicine

## 2023-07-20 ENCOUNTER — Ambulatory Visit: Payer: Managed Care, Other (non HMO) | Admitting: Internal Medicine

## 2023-07-20 VITALS — BP 132/76 | HR 76 | Ht 72.0 in | Wt 344.0 lb

## 2023-07-20 DIAGNOSIS — Z6841 Body Mass Index (BMI) 40.0 and over, adult: Secondary | ICD-10-CM

## 2023-07-20 DIAGNOSIS — G4733 Obstructive sleep apnea (adult) (pediatric): Secondary | ICD-10-CM

## 2023-07-20 DIAGNOSIS — I10 Essential (primary) hypertension: Secondary | ICD-10-CM | POA: Diagnosis not present

## 2023-07-20 NOTE — Progress Notes (Signed)
Date:  07/20/2023   Name:  Matthew Schaefer.   DOB:  [birth year]   MRN:  [id number]   Chief Complaint: Hypertension  Hypertension This is a chronic problem. The problem is controlled. Pertinent negatives include no chest pain, headaches, palpitations or shortness of breath. Past treatments include ACE inhibitors.  OSA - on CPAP and doing well. Last compliance update in May with excellent compliance and benefit. Usage:89/90 Average hours per night: 5.5 h AHI:0.43  Weight management - pt is here for weight management follow up.  Started on Semaglutide compounded on July 2024.  Doing well without side effects.  Starting weight 355 lbs.   Today's weight 344 lbs.  Current diet is low carb and current exercise routine is home workout 4 days per week.  Lab Results  Component Value Date   NA 138 02/19/2023   K 4.5 02/19/2023   CO2 21 02/19/2023   GLUCOSE 100 (H) 02/19/2023   BUN 18 02/19/2023   CREATININE 0.92 02/19/2023   CALCIUM 9.1 02/19/2023   EGFR 93 02/19/2023   GFRNONAA 76 09/26/2020   Lab Results  Component Value Date   CHOL 180 10/06/2022   HDL 79 10/06/2022   LDLCALC 91 10/06/2022   TRIG 51 10/06/2022   CHOLHDL 2.3 10/06/2022   Lab Results  Component Value Date   TSH 1.150 09/26/2019   Lab Results  Component Value Date   HGBA1C 6.0 (H) 10/06/2022   Lab Results  Component Value Date   WBC 2.8 (L) 10/06/2022   HGB 15.5 10/06/2022   HCT 44.4 10/06/2022   MCV 83 10/06/2022   PLT 157 10/06/2022   Lab Results  Component Value Date   ALT 19 10/06/2022   AST 21 10/06/2022   ALKPHOS 86 10/06/2022   BILITOT 0.4 10/06/2022   No results found for: "25OHVITD2", "25OHVITD3", "VD25OH"   Review of Systems  Constitutional:  Negative for fatigue and unexpected weight change.  HENT:  Negative for nosebleeds.   Eyes:  Negative for visual disturbance.  Respiratory:  Negative for cough, chest tightness, shortness of breath and wheezing.   Cardiovascular:  Negative for  chest pain, palpitations and leg swelling.  Gastrointestinal:  Negative for abdominal pain, constipation and diarrhea.  Neurological:  Negative for dizziness, weakness, light-headedness and headaches.    Patient Active Problem List   Diagnosis Date Noted   OSA (obstructive sleep apnea) 02/19/2023   Prediabetes 10/07/2022   BMI 50.0-59.9, adult (HCC) 03/03/2016   Essential hypertension 05/01/2015   Environmental and seasonal allergies 05/01/2015   GERD without esophagitis 05/01/2015   Osteoarthritis of spine 05/01/2015    Allergies  Allergen Reactions   Augmentin [Amoxicillin-Pot Clavulanate] Palpitations    Past Surgical History:  Procedure Laterality Date   COLONOSCOPY  2011   normal   COLONOSCOPY WITH PROPOFOL N/A 11/16/2020   Procedure: COLONOSCOPY WITH PROPOFOL;  Surgeon: Wyline Mood, MD;  [facility]: [facility] ENDOSCOPY;  Service: Gastroenterology;  Laterality: N/A;   ESOPHAGOGASTRODUODENOSCOPY  2013   HH, gastritis, H Pylori (-)   REPLACEMENT TOTAL KNEE BILATERAL Bilateral 2009    Social History   Tobacco Use   Smoking status: Never   Smokeless tobacco: Current    Types: Snuff  Vaping Use   Vaping status: Never Used  Substance Use Topics   Alcohol use: No    Alcohol/week: 0.0 standard drinks of alcohol   Drug use: No     Medication list has been reviewed and updated.  Current Meds  Medication Sig  fluticasone (FLONASE) 50 MCG/ACT nasal spray Place 2 sprays into both nostrils daily.   gabapentin (NEURONTIN) 100 MG capsule Take 1 capsule (100 mg total) by mouth daily as needed.   lisinopril (ZESTRIL) 20 MG tablet Take 1 tablet (20 mg total) by mouth daily.   Loratadine (CLARITIN PO) Take 1 tablet by mouth as needed.   Multiple Vitamin (MULTIVITAMIN) tablet Take 1 tablet by mouth daily.   Semaglutide, 1 MG/DOSE, 2 MG/1.5ML SOPN Inject into the skin. [facility]       06/15/2023    2:56 PM 03/27/2023   10:17 AM 02/19/2023    8:13 AM 12/22/2022    3:21 PM  GAD 7  : Generalized Anxiety Score  Nervous, Anxious, on Edge 0 1 1 1   Control/stop worrying 0 0 1 1  Worry too much - different things 0 0 1 0  Trouble relaxing 0 0 1 1  Restless 0 0 0 0  Easily annoyed or irritable 0 0 0 0  Afraid - awful might happen 0 1 1 0  Total GAD 7 Score 0 2 5 3   Anxiety Difficulty Not difficult at all Not difficult at all Somewhat difficult Not difficult at all       06/15/2023    2:56 PM 03/27/2023   10:17 AM 02/19/2023    8:13 AM  Depression screen PHQ 2/9  Decreased Interest 0 0 0  Down, Depressed, Hopeless 0 0 0  PHQ - 2 Score 0 0 0  Altered sleeping 0 0 0  Tired, decreased energy 0 1 0  Change in appetite 0 0 0  Feeling bad or failure about yourself  0 0 0  Trouble concentrating 0 0 0  Moving slowly or fidgety/restless 0 0 0  Suicidal thoughts 0 0 0  PHQ-9 Score 0 1 0  Difficult doing work/chores Not difficult at all Not difficult at all Not difficult at all    BP Readings from Last 3 Encounters:  07/20/23 132/76  06/15/23 122/78  03/27/23 128/78    Physical Exam Vitals and nursing note reviewed.  Constitutional:      General: He is not in acute distress.    Appearance: He is well-developed.  HENT:     Head: Normocephalic and atraumatic.  Cardiovascular:     Rate and Rhythm: Normal rate and regular rhythm.     Pulses: Normal pulses.     Heart sounds: No murmur heard. Pulmonary:     Effort: Pulmonary effort is normal. No respiratory distress.     Breath sounds: No wheezing or rhonchi.  Musculoskeletal:     Cervical back: Normal range of motion.     Right lower leg: No edema.     Left lower leg: No edema.  Lymphadenopathy:     Cervical: No cervical adenopathy.  Skin:    General: Skin is warm and dry.     Findings: No rash.  Neurological:     General: No focal deficit present.     Mental Status: He is alert and oriented to person, place, and time.  Psychiatric:        Mood and Affect: Mood normal.        Behavior: Behavior normal.      Wt Readings from Last 3 Encounters:  07/20/23 (!) 344 lb (156 kg)  06/15/23 (!) 349 lb (158.3 kg)  03/27/23 (!) 355 lb (161 kg)    BP 132/76 (BP Location: Left Arm, Cuff Size: Large)   Pulse 76  Ht 6' (1.829 m)   Wt (!) 344 lb (156 kg)   SpO2 99%   BMI 46.65 kg/m   Assessment and Plan:  Problem List Items Addressed This Visit       Unprioritized   OSA (obstructive sleep apnea)    On Auto CPAP nightly. Sleeps well and feels well. He is deriving benefit from the treatment.      Essential hypertension - Primary (Chronic)    BP well controlled on lisinopril at home.  Will continue the same dose. He is working on weight loss and exercising regularly.      BMI 50.0-59.9, adult [facility])    Started Semaglutide in July 2024 - now on 1 mg weekly. Weight loss to date is 11lbs        No follow-ups on file.    Reubin Milan, MD [facility] [place]

## 2023-07-20 NOTE — Assessment & Plan Note (Signed)
On Auto CPAP nightly. Sleeps well and feels well. He is deriving benefit from the treatment.

## 2023-07-20 NOTE — Assessment & Plan Note (Addendum)
BP well controlled on lisinopril at home.  Will continue the same dose. He is working on weight loss and exercising regularly.

## 2023-07-20 NOTE — Assessment & Plan Note (Addendum)
Started Semaglutide in July 2024 - now on 1 mg weekly. Weight loss to date is 11lbs

## 2023-08-07 ENCOUNTER — Encounter: Payer: Self-pay | Admitting: Internal Medicine

## 2023-08-07 ENCOUNTER — Other Ambulatory Visit: Payer: Self-pay | Admitting: Internal Medicine

## 2023-08-07 DIAGNOSIS — Z6841 Body Mass Index (BMI) 40.0 and over, adult: Secondary | ICD-10-CM

## 2023-08-07 MED ORDER — SEMAGLUTIDE (1 MG/DOSE) 2 MG/1.5ML ~~LOC~~ SOPN
1.0000 mg | PEN_INJECTOR | SUBCUTANEOUS | 3 refills | Status: DC
Start: 2023-08-07 — End: 2023-10-12

## 2023-09-16 ENCOUNTER — Telehealth: Payer: Self-pay

## 2023-09-16 DIAGNOSIS — Z860100 Personal history of colon polyps, unspecified: Secondary | ICD-10-CM

## 2023-09-16 NOTE — Telephone Encounter (Signed)
Patient called in to schedule a repeat colonoscopy. Per Dr. Tobi Bastos I recommend you have a repeat colonoscopy in 3 years to determine if you have developed any new polyps and to screen for colorectal cancer. The patient want Dr.Anna to do the repeat.

## 2023-10-12 ENCOUNTER — Ambulatory Visit (INDEPENDENT_AMBULATORY_CARE_PROVIDER_SITE_OTHER): Payer: Managed Care, Other (non HMO) | Admitting: Internal Medicine

## 2023-10-12 ENCOUNTER — Encounter: Payer: Self-pay | Admitting: Internal Medicine

## 2023-10-12 VITALS — BP 128/76 | HR 87 | Ht 72.0 in | Wt 332.0 lb

## 2023-10-12 DIAGNOSIS — R1084 Generalized abdominal pain: Secondary | ICD-10-CM

## 2023-10-12 DIAGNOSIS — R002 Palpitations: Secondary | ICD-10-CM | POA: Insufficient documentation

## 2023-10-12 DIAGNOSIS — I10 Essential (primary) hypertension: Secondary | ICD-10-CM

## 2023-10-12 DIAGNOSIS — Z125 Encounter for screening for malignant neoplasm of prostate: Secondary | ICD-10-CM

## 2023-10-12 DIAGNOSIS — Z0000 Encounter for general adult medical examination without abnormal findings: Secondary | ICD-10-CM | POA: Diagnosis not present

## 2023-10-12 DIAGNOSIS — Z1211 Encounter for screening for malignant neoplasm of colon: Secondary | ICD-10-CM

## 2023-10-12 DIAGNOSIS — Z6841 Body Mass Index (BMI) 40.0 and over, adult: Secondary | ICD-10-CM

## 2023-10-12 DIAGNOSIS — R7303 Prediabetes: Secondary | ICD-10-CM | POA: Diagnosis not present

## 2023-10-12 DIAGNOSIS — M47894 Other spondylosis, thoracic region: Secondary | ICD-10-CM

## 2023-10-12 MED ORDER — SEMAGLUTIDE (1 MG/DOSE) 2 MG/1.5ML ~~LOC~~ SOPN
1.0000 mg | PEN_INJECTOR | SUBCUTANEOUS | 3 refills | Status: DC
Start: 2023-10-12 — End: 2024-04-18

## 2023-10-12 MED ORDER — GABAPENTIN 100 MG PO CAPS: 100.0000 mg | ORAL_CAPSULE | Freq: Every day | ORAL | 0 refills | Status: AC | PRN

## 2023-10-12 MED ORDER — LISINOPRIL 20 MG PO TABS: 20.0000 mg | ORAL_TABLET | Freq: Every day | ORAL | 1 refills | Status: DC

## 2023-10-12 NOTE — Progress Notes (Signed)
Date:  10/12/2023   Name:  Matthew Schaefer   DOB:  [birth year]   MRN:  [id number]   Chief Complaint: Annual Exam Matthew Schaefer is a 62 y.o. male who presents today for his Complete Annual Exam. He feels well. He reports exercising cardio 3-4 times a week. He reports he is sleeping well.   Colonoscopy: 11/2020 repeat 3 yrs  Immunization History  Administered Date(s) Administered   Tdap 09/05/2013   Health Maintenance Due  Topic Date Due   DTaP/Tdap/Td (2 - Td or Tdap) 09/06/2023   Colonoscopy  11/17/2023    Lab Results  Component Value Date   PSA1 0.4 10/06/2022   PSA1 0.4 09/30/2021   PSA1 0.4 09/26/2020   PSA 0.4 09/13/2014     Hypertension This is a chronic problem. The problem is controlled. Associated symptoms include palpitations. Pertinent negatives include no chest pain, headaches or shortness of breath. Past treatments include ACE inhibitors.  Diabetes He presents for his follow-up diabetic visit. Diabetes type: prediabetes. His disease course has been improving. Pertinent negatives for hypoglycemia include no dizziness, headaches or nervousness/anxiousness. Pertinent negatives for diabetes include no chest pain and no fatigue. Current diabetic treatments: Semaglutide for weight loss.  Palpitations  This is a recurrent problem. The problem occurs intermittently. The symptoms are aggravated by stress (anxiety at times). Pertinent negatives include no anxiety, chest pain, diaphoresis, dizziness or shortness of breath. He has tried nothing for the symptoms.    Review of Systems  Constitutional:  Negative for appetite change, chills, diaphoresis, fatigue and unexpected weight change.  HENT:  Negative for hearing loss, tinnitus, trouble swallowing and voice change.   Eyes:  Negative for visual disturbance.  Respiratory:  Negative for choking, shortness of breath and wheezing.   Cardiovascular:  Positive for palpitations. Negative for chest pain and leg swelling.   Gastrointestinal:  Negative for abdominal pain, blood in stool, constipation and diarrhea.  Genitourinary:  Negative for difficulty urinating, dysuria and frequency.  Musculoskeletal:  Negative for arthralgias, back pain and myalgias.  Skin:  Negative for color change and rash.  Neurological:  Negative for dizziness, syncope and headaches.  Hematological:  Negative for adenopathy.  Psychiatric/Behavioral:  Negative for dysphoric mood and sleep disturbance. The patient is not nervous/anxious.      Lab Results  Component Value Date   NA 138 02/19/2023   K 4.5 02/19/2023   CO2 21 02/19/2023   GLUCOSE 100 (H) 02/19/2023   BUN 18 02/19/2023   CREATININE 0.92 02/19/2023   CALCIUM 9.1 02/19/2023   EGFR 93 02/19/2023   GFRNONAA 76 09/26/2020   Lab Results  Component Value Date   CHOL 180 10/06/2022   HDL 79 10/06/2022   LDLCALC 91 10/06/2022   TRIG 51 10/06/2022   CHOLHDL 2.3 10/06/2022   Lab Results  Component Value Date   TSH 1.150 09/26/2019   Lab Results  Component Value Date   HGBA1C 6.0 (H) 10/06/2022   Lab Results  Component Value Date   WBC 2.8 (L) 10/06/2022   HGB 15.5 10/06/2022   HCT 44.4 10/06/2022   MCV 83 10/06/2022   PLT 157 10/06/2022   Lab Results  Component Value Date   ALT 19 10/06/2022   AST 21 10/06/2022   ALKPHOS 86 10/06/2022   BILITOT 0.4 10/06/2022   No results found for: "25OHVITD2", "25OHVITD3", "VD25OH"   Patient Active Problem List   Diagnosis Date Noted   Heart palpitations 10/12/2023   OSA (obstructive sleep apnea) 02/19/2023  Prediabetes 10/07/2022   BMI 50.0-59.9, adult (HCC) 03/03/2016   Essential hypertension 05/01/2015   Environmental and seasonal allergies 05/01/2015   GERD without esophagitis 05/01/2015   Osteoarthritis of spine 05/01/2015    Allergies  Allergen Reactions   Augmentin [Amoxicillin-Pot Clavulanate] Palpitations    Past Surgical History:  Procedure Laterality Date   COLONOSCOPY  11/10/2009    normal   COLONOSCOPY WITH PROPOFOL N/A 11/16/2020   Procedure: COLONOSCOPY WITH PROPOFOL;  Surgeon: Wyline Mood, MD;  Location: [facility] ENDOSCOPY;  Service: Gastroenterology;  Laterality: N/A;   ESOPHAGOGASTRODUODENOSCOPY  11/11/2011   HH, gastritis, H Pylori (-)   JOINT REPLACEMENT  12/2007   bilateral knee   REPLACEMENT TOTAL KNEE BILATERAL Bilateral 11/11/2007    Social History   Tobacco Use   Smoking status: Never   Smokeless tobacco: Current    Types: Snuff  Vaping Use   Vaping status: Never Used  Substance Use Topics   Alcohol use: No   Drug use: No     Medication list has been reviewed and updated.  Current Meds  Medication Sig   fluticasone (FLONASE) 50 MCG/ACT nasal spray Place 2 sprays into both nostrils daily.   Loratadine (CLARITIN PO) Take 1 tablet by mouth as needed.   Multiple Vitamin (MULTIVITAMIN) tablet Take 1 tablet by mouth daily.   [DISCONTINUED] gabapentin (NEURONTIN) 100 MG capsule Take 1 capsule (100 mg total) by mouth daily as needed.   [DISCONTINUED] lisinopril (ZESTRIL) 20 MG tablet Take 1 tablet (20 mg total) by mouth daily.   [DISCONTINUED] Semaglutide, 1 MG/DOSE, 2 MG/1.5ML SOPN Inject 1 mg into the skin once a week. May compound.       10/12/2023    8:01 AM 06/15/2023    2:56 PM 03/27/2023   10:17 AM 02/19/2023    8:13 AM  GAD 7 : Generalized Anxiety Score  Nervous, Anxious, on Edge 2 0 1 1  Control/stop worrying 2 0 0 1  Worry too much - different things 1 0 0 1  Trouble relaxing 1 0 0 1  Restless 0 0 0 0  Easily annoyed or irritable 0 0 0 0  Afraid - awful might happen 0 0 1 1  Total GAD 7 Score 6 0 2 5  Anxiety Difficulty Not difficult at all Not difficult at all Not difficult at all Somewhat difficult       10/12/2023    8:01 AM 06/15/2023    2:56 PM 03/27/2023   10:17 AM  Depression screen PHQ 2/9  Decreased Interest 0 0 0  Down, Depressed, Hopeless 0 0 0  PHQ - 2 Score 0 0 0  Altered sleeping 0 0 0  Tired, decreased energy 0 0 1   Change in appetite 0 0 0  Feeling bad or failure about yourself  0 0 0  Trouble concentrating 0 0 0  Moving slowly or fidgety/restless 0 0 0  Suicidal thoughts 0 0 0  PHQ-9 Score 0 0 1  Difficult doing work/chores Not difficult at all Not difficult at all Not difficult at all    BP Readings from Last 3 Encounters:  10/12/23 128/76  07/20/23 132/76  06/15/23 122/78    Physical Exam Vitals and nursing note reviewed.  Constitutional:      Appearance: Normal appearance. He is well-developed.  HENT:     Head: Normocephalic.     Right Ear: Tympanic membrane, ear canal and external ear normal.     Left Ear: Tympanic membrane, ear canal and  external ear normal.     Nose: Nose normal.  Eyes:     Conjunctiva/sclera: Conjunctivae normal.     Pupils: Pupils are equal, round, and reactive to light.  Neck:     Thyroid: No thyromegaly.     Vascular: No carotid bruit.  Cardiovascular:     Rate and Rhythm: Normal rate and regular rhythm. Frequent Extrasystoles are present.    Pulses: Normal pulses.     Heart sounds: Normal heart sounds.  Pulmonary:     Effort: Pulmonary effort is normal.     Breath sounds: Normal breath sounds. No wheezing.  Chest:  Breasts:    Right: No mass.     Left: No mass.  Abdominal:     General: Bowel sounds are normal.     Palpations: Abdomen is soft.     Tenderness: There is no abdominal tenderness.  Musculoskeletal:        General: Normal range of motion.     Cervical back: Normal range of motion and neck supple.     Right lower leg: No edema.     Left lower leg: No edema.  Lymphadenopathy:     Cervical: No cervical adenopathy.  Skin:    General: Skin is warm and dry.     Capillary Refill: Capillary refill takes less than 2 seconds.  Neurological:     Mental Status: He is alert and oriented to person, place, and time.     Deep Tendon Reflexes: Reflexes are normal and symmetric.  Psychiatric:        Attention and Perception: Attention normal.         Mood and Affect: Mood normal.        Thought Content: Thought content normal.     Wt Readings from Last 3 Encounters:  10/12/23 (!) 332 lb (150.6 kg)  07/20/23 (!) 344 lb (156 kg)  06/15/23 (!) 349 lb (158.3 kg)    BP 128/76   Pulse 87   Ht 6' (1.829 m)   Wt (!) 332 lb (150.6 kg)   SpO2 95%   BMI 45.03 kg/m   Assessment and Plan:  Problem List Items Addressed This Visit       Unprioritized   BMI 50.0-59.9, adult (HCC)    On Semaglutide since July 2024 Current weight loss to date is 23 lbs      Relevant Medications   Semaglutide, 1 MG/DOSE, 2 MG/1.5ML SOPN   Essential hypertension (Chronic)    Controlled BP with normal exam. Current regimen is lisinopril started earlier this year. Will continue same medications; encourage continued reduced sodium diet.       Relevant Medications   lisinopril (ZESTRIL) 20 MG tablet   Other Relevant Orders   CBC with Differential/Platelet   Comprehensive metabolic panel   Urinalysis, Routine w reflex microscopic   Heart palpitations    Minimally symptomatic at this time. Will monitor for worsening - pt declined beta blocker today      Relevant Orders   TSH   Osteoarthritis of spine   Relevant Medications   gabapentin (NEURONTIN) 100 MG capsule   Prediabetes (Chronic)    Controlled with diet and recent addition of semaglutide. Lab Results  Component Value Date   HGBA1C 6.0 (H) 10/06/2022         Relevant Orders   Hemoglobin A1c   Other Visit Diagnoses     Annual physical exam    -  Primary   Relevant Orders   CBC with Differential/Platelet  Comprehensive metabolic panel   Hemoglobin A1c   Lipid panel   PSA   Amylase   TSH   Colon cancer screening       due in January - GI aware and will call him after December   Prostate cancer screening       Relevant Orders   PSA   Generalized abdominal pain       mild intermittent pain similar to GERD will rule out pancreatic source due to [facility]    Relevant Orders   Amylase       Return in about 3 months (around 01/10/2024) for HTN, weight check.    Reubin Milan, MD [facility] [place]

## 2023-10-12 NOTE — Assessment & Plan Note (Signed)
Controlled BP with normal exam. Current regimen is lisinopril started earlier this year. Will continue same medications; encourage continued reduced sodium diet.

## 2023-10-12 NOTE — Assessment & Plan Note (Signed)
Controlled with diet and recent addition of semaglutide. Lab Results  Component Value Date   HGBA1C 6.0 (H) 10/06/2022

## 2023-10-12 NOTE — Assessment & Plan Note (Addendum)
On Semaglutide since July 2024 Current weight loss to date is 23 lbs

## 2023-10-12 NOTE — Assessment & Plan Note (Signed)
Minimally symptomatic at this time. Will monitor for worsening - pt declined beta blocker today

## 2023-10-13 LAB — CBC WITH DIFFERENTIAL/PLATELET
Basophils Absolute: 0 10*3/uL (ref 0.0–0.2)
Basos: 1 %
EOS (ABSOLUTE): 0.1 10*3/uL (ref 0.0–0.4)
Eos: 3 %
Hematocrit: 45.4 % (ref 37.5–51.0)
Hemoglobin: 15.6 g/dL (ref 13.0–17.7)
Immature Grans (Abs): 0 10*3/uL (ref 0.0–0.1)
Immature Granulocytes: 0 %
Lymphocytes Absolute: 1.3 10*3/uL (ref 0.7–3.1)
Lymphs: 39 %
MCH: 30.5 pg (ref 26.6–33.0)
MCHC: 34.4 g/dL (ref 31.5–35.7)
MCV: 89 fL (ref 79–97)
Monocytes Absolute: 0.6 10*3/uL (ref 0.1–0.9)
Monocytes: 18 %
Neutrophils Absolute: 1.3 10*3/uL — ABNORMAL LOW (ref 1.4–7.0)
Neutrophils: 39 %
Platelets: 176 10*3/uL (ref 150–450)
RBC: 5.11 x10E6/uL (ref 4.14–5.80)
RDW: 12.5 % (ref 11.6–15.4)
WBC: 3.3 10*3/uL — ABNORMAL LOW (ref 3.4–10.8)

## 2023-10-13 LAB — COMPREHENSIVE METABOLIC PANEL
ALT: 22 [IU]/L (ref 0–44)
AST: 25 [IU]/L (ref 0–40)
Albumin: 3.9 g/dL (ref 3.9–4.9)
Alkaline Phosphatase: 74 [IU]/L (ref 44–121)
BUN/Creatinine Ratio: 14 (ref 10–24)
BUN: 14 mg/dL (ref 8–27)
Bilirubin Total: 0.6 mg/dL (ref 0.0–1.2)
CO2: 25 mmol/L (ref 20–29)
Calcium: 9.2 mg/dL (ref 8.6–10.2)
Chloride: 102 mmol/L (ref 96–106)
Creatinine, Ser: 1.02 mg/dL (ref 0.76–1.27)
Globulin, Total: 2.8 g/dL (ref 1.5–4.5)
Glucose: 97 mg/dL (ref 70–99)
Potassium: 4.5 mmol/L (ref 3.5–5.2)
Sodium: 139 mmol/L (ref 134–144)
Total Protein: 6.7 g/dL (ref 6.0–8.5)
eGFR: 82 mL/min/{1.73_m2} (ref >59)

## 2023-10-13 LAB — URINALYSIS, ROUTINE W REFLEX MICROSCOPIC
Bilirubin, UA: NEGATIVE
Glucose, UA: NEGATIVE
Ketones, UA: NEGATIVE
Leukocytes,UA: NEGATIVE
Nitrite, UA: NEGATIVE
Protein,UA: NEGATIVE
RBC, UA: NEGATIVE
Specific Gravity, UA: 1.024 (ref 1.005–1.030)
Urobilinogen, Ur: 0.2 mg/dL (ref 0.2–1.0)
pH, UA: 6 (ref 5.0–7.5)

## 2023-10-13 LAB — LIPID PANEL
Chol/HDL Ratio: 2.5 {ratio} (ref 0.0–5.0)
Cholesterol, Total: 162 mg/dL (ref 100–199)
HDL: 65 mg/dL (ref >39)
LDL Chol Calc (NIH): 86 mg/dL (ref 0–99)
Triglycerides: 55 mg/dL (ref 0–149)
VLDL Cholesterol Cal: 11 mg/dL (ref 5–40)

## 2023-10-13 LAB — HEMOGLOBIN A1C
Est. average glucose Bld gHb Est-mCnc: 108 mg/dL
Hgb A1c MFr Bld: 5.4 % (ref 4.8–5.6)

## 2023-10-13 LAB — TSH: TSH: 1.2 u[IU]/mL (ref 0.450–4.500)

## 2023-10-13 LAB — PSA: Prostate Specific Ag, Serum: 0.5 ng/mL (ref 0.0–4.0)

## 2023-10-13 LAB — AMYLASE: Amylase: 92 U/L (ref 31–110)

## 2023-11-09 NOTE — Addendum Note (Signed)
Addended by: Avie Arenas on: 11/09/2023 09:54 AM   Modules accepted: Orders

## 2023-11-09 NOTE — Telephone Encounter (Signed)
Contacted patient to see if he is ready to schedule his repeat colonoscopy with Dr. Tobi Bastos.  LVM.  Thanks,  [place], [place]

## 2023-11-17 ENCOUNTER — Other Ambulatory Visit: Payer: Self-pay

## 2023-11-17 ENCOUNTER — Telehealth: Payer: Self-pay

## 2023-11-17 DIAGNOSIS — Z860100 Personal history of colon polyps, unspecified: Secondary | ICD-10-CM

## 2023-11-17 MED ORDER — NA SULFATE-K SULFATE-MG SULF 17.5-3.13-1.6 GM/177ML PO SOLN: 1.0000 | Freq: Once | ORAL | 0 refills | Status: AC

## 2023-11-17 NOTE — Telephone Encounter (Signed)
 Gastroenterology Pre-Procedure Review  Request Date: 12/28/23 Requesting Physician: Dr. Therisa  PATIENT REVIEW QUESTIONS: The patient responded to the following health history questions as indicated:    1. Are you having any GI issues? no 2. Do you have a personal history of Polyps? yes (last colonoscopy performed by Dr. Therisa 11/16/20 recommended repeat in 3 years) 3. Do you have a family history of Colon Cancer or Polyps? no 4. Diabetes Mellitus? no 5. Joint replacements in the past 12 months?no 6. Major health problems in the past 3 months?no 7. Any artificial heart valves, MVP, or defibrillator?no    MEDICATIONS & ALLERGIES:    Patient reports the following regarding taking any anticoagulation/antiplatelet therapy:   Plavix, Coumadin, Eliquis, Xarelto, Lovenox, Pradaxa, Brilinta, or Effient? no Aspirin? no  Patient confirms/reports the following medications:  Current Outpatient Medications  Medication Sig Dispense Refill   fluticasone  (FLONASE ) 50 MCG/ACT nasal spray Place 2 sprays into both nostrils daily. 16 g 2   gabapentin  (NEURONTIN ) 100 MG capsule Take 1 capsule (100 mg total) by mouth daily as needed. 90 capsule 0   lisinopril  (ZESTRIL ) 20 MG tablet Take 1 tablet (20 mg total) by mouth daily. 90 tablet 1   Loratadine (CLARITIN PO) Take 1 tablet by mouth as needed.     Multiple Vitamin (MULTIVITAMIN) tablet Take 1 tablet by mouth daily.     Semaglutide , 1 MG/DOSE, 2 MG/1.5ML SOPN Inject 1 mg into the skin once a week. May compound. 2 mL 3   No current facility-administered medications for this visit.    Patient confirms/reports the following allergies:  Allergies  Allergen Reactions   Augmentin [Amoxicillin -Pot Clavulanate] Palpitations    No orders of the defined types were placed in this encounter.   AUTHORIZATION INFORMATION Primary Insurance: 1D#: Group #:  Secondary Insurance: 1D#: Group #:  SCHEDULE INFORMATION: Date: 12/28/23 Time: Location: [facility]

## 2023-12-28 ENCOUNTER — Encounter
Admission: RE | Disposition: A | Discharge status: Home / Self Care | Payer: Self-pay | Source: Ambulatory Visit | Attending: Gastroenterology

## 2023-12-28 ENCOUNTER — Ambulatory Visit: Payer: Managed Care, Other (non HMO) | Admitting: Certified Registered Nurse Anesthetist

## 2023-12-28 ENCOUNTER — Other Ambulatory Visit: Payer: Self-pay

## 2023-12-28 ENCOUNTER — Ambulatory Visit
Admission: RE | Admit: 2023-12-28 | Discharge: 2023-12-28 | Disposition: A | Discharge status: Home / Self Care | Payer: Managed Care, Other (non HMO) | Source: Ambulatory Visit | Attending: Gastroenterology | Admitting: Gastroenterology

## 2023-12-28 ENCOUNTER — Encounter: Payer: Self-pay | Admitting: Gastroenterology

## 2023-12-28 DIAGNOSIS — D124 Benign neoplasm of descending colon: Secondary | ICD-10-CM | POA: Diagnosis not present

## 2023-12-28 DIAGNOSIS — D123 Benign neoplasm of transverse colon: Secondary | ICD-10-CM | POA: Diagnosis not present

## 2023-12-28 DIAGNOSIS — Z1211 Encounter for screening for malignant neoplasm of colon: Secondary | ICD-10-CM | POA: Diagnosis not present

## 2023-12-28 DIAGNOSIS — D126 Benign neoplasm of colon, unspecified: Secondary | ICD-10-CM

## 2023-12-28 DIAGNOSIS — G4730 Sleep apnea, unspecified: Secondary | ICD-10-CM | POA: Diagnosis not present

## 2023-12-28 DIAGNOSIS — I10 Essential (primary) hypertension: Secondary | ICD-10-CM | POA: Insufficient documentation

## 2023-12-28 DIAGNOSIS — K219 Gastro-esophageal reflux disease without esophagitis: Secondary | ICD-10-CM | POA: Insufficient documentation

## 2023-12-28 DIAGNOSIS — Z860100 Personal history of colon polyps, unspecified: Secondary | ICD-10-CM

## 2023-12-28 DIAGNOSIS — Z09 Encounter for follow-up examination after completed treatment for conditions other than malignant neoplasm: Secondary | ICD-10-CM | POA: Diagnosis present

## 2023-12-28 DIAGNOSIS — Z79899 Other long term (current) drug therapy: Secondary | ICD-10-CM | POA: Diagnosis not present

## 2023-12-28 HISTORY — PX: POLYPECTOMY: SHX5525

## 2023-12-28 HISTORY — PX: COLONOSCOPY WITH PROPOFOL: SHX5780

## 2023-12-28 SURGERY — COLONOSCOPY WITH PROPOFOL
Anesthesia: General

## 2023-12-28 MED ORDER — LIDOCAINE HCL (PF) 2 % IJ SOLN
INTRAMUSCULAR | Status: AC
Start: 2023-12-28 — End: ?
  Filled 2023-12-28: qty 5

## 2023-12-28 MED ORDER — STERILE WATER FOR IRRIGATION IR SOLN
Status: DC | PRN
  Administered 2023-12-28: 60 mL

## 2023-12-28 MED ORDER — SODIUM CHLORIDE 0.9 % IV SOLN: INTRAVENOUS | Status: DC

## 2023-12-28 MED ORDER — LIDOCAINE HCL (CARDIAC) PF 100 MG/5ML IV SOSY
PREFILLED_SYRINGE | INTRAVENOUS | Status: DC | PRN
  Administered 2023-12-28: 50 mg via INTRAVENOUS

## 2023-12-28 MED ORDER — PROPOFOL 10 MG/ML IV BOLUS
INTRAVENOUS | Status: DC | PRN
  Administered 2023-12-28: 125 ug/kg/min via INTRAVENOUS
  Administered 2023-12-28: 90 mg via INTRAVENOUS

## 2023-12-28 MED ORDER — PROPOFOL 1000 MG/100ML IV EMUL
INTRAVENOUS | Status: AC
  Filled 2023-12-28: qty 100

## 2023-12-28 NOTE — Anesthesia Preprocedure Evaluation (Signed)
 Anesthesia Evaluation  Patient identified by MRN, date of birth, ID band Patient awake    Reviewed: Allergy & Precautions, NPO status , Patient's Chart, lab work & pertinent test results  History of Anesthesia Complications Negative for: history of anesthetic complications  Airway Mallampati: II  TM Distance: >3 FB Neck ROM: full    Dental no notable dental hx.    Pulmonary sleep apnea and Continuous Positive Airway Pressure Ventilation    Pulmonary exam normal        Cardiovascular hypertension, On Medications negative cardio ROS Normal cardiovascular exam     Neuro/Psych negative neurological ROS  negative psych ROS   GI/Hepatic Neg liver ROS,GERD  Medicated,,  Endo/Other  negative endocrine ROS    Renal/GU negative Renal ROS  negative genitourinary   Musculoskeletal  (+) Arthritis ,    Abdominal   Peds  Hematology negative hematology ROS (+)   Anesthesia Other Findings Past Medical History: No date: Allergy     Comment:  seasonal No date: Arthritis     Comment:  spinal 12/2007: Blood transfusion without reported diagnosis     Comment:  knee surgery 05/01/2015: Essential hypertension No date: Hypertension  Past Surgical History: 11/10/2009: COLONOSCOPY     Comment:  normal 11/16/2020: COLONOSCOPY WITH PROPOFOL; N/A     Comment:  Procedure: COLONOSCOPY WITH PROPOFOL;  Surgeon: Wyline Mood, MD;  Location: [facility] ENDOSCOPY;  Service:               Gastroenterology;  Laterality: N/A; 11/11/2011: ESOPHAGOGASTRODUODENOSCOPY     Comment:  HH, gastritis, H Pylori (-) 12/2007: JOINT REPLACEMENT     Comment:  bilateral knee 11/11/2007: REPLACEMENT TOTAL KNEE BILATERAL; Bilateral  BMI    Body Mass Index: 44.47 kg/m      Reproductive/Obstetrics negative OB ROS                             Anesthesia Physical Anesthesia Plan  ASA: 2  Anesthesia Plan: General    Post-op Pain Management: Minimal or no pain anticipated   Induction: Intravenous  PONV Risk Score and Plan: 1 and Propofol infusion and TIVA  Airway Management Planned: Natural Airway and Nasal Cannula  Additional Equipment:   Intra-op Plan:   Post-operative Plan:   Informed Consent: I have reviewed the patients History and Physical, chart, labs and discussed the procedure including the risks, benefits and alternatives for the proposed anesthesia with the patient or authorized representative who has indicated his/her understanding and acceptance.     Dental Advisory Given  Plan Discussed with: Anesthesiologist, CRNA and Surgeon  Anesthesia Plan Comments: (Patient consented for risks of anesthesia including but not limited to:  - adverse reactions to medications - risk of airway placement if required - damage to eyes, teeth, lips or other oral mucosa - nerve damage due to positioning  - sore throat or hoarseness - Damage to heart, brain, nerves, lungs, other parts of body or loss of life  Patient voiced understanding and assent.)       Anesthesia Quick Evaluation

## 2023-12-28 NOTE — Anesthesia Postprocedure Evaluation (Signed)
 Anesthesia Post Note  Patient: Joshia Kitchings  Procedure(s) Performed: COLONOSCOPY WITH PROPOFOL POLYPECTOMY  Patient location during evaluation: Endoscopy Anesthesia Type: General Level of consciousness: awake and alert Pain management: pain level controlled Vital Signs Assessment: post-procedure vital signs reviewed and stable Respiratory status: spontaneous breathing, nonlabored ventilation, respiratory function stable and patient connected to nasal cannula oxygen Cardiovascular status: blood pressure returned to baseline and stable Postop Assessment: no apparent nausea or vomiting Anesthetic complications: no   No notable events documented.   Last Vitals:  Vitals:   12/28/23 0932 12/28/23 0942  BP: 106/72 120/72  Pulse: 72 60  Resp: 15 15  Temp:    SpO2: 100% 100%    Last Pain:  Vitals:   12/28/23 0942  TempSrc:   PainSc: 0-No pain                 Louie Boston

## 2023-12-28 NOTE — Transfer of Care (Signed)
 Immediate Anesthesia Transfer of Care Note  Patient: Matthew Schaefer  Procedure(s) Performed: COLONOSCOPY WITH PROPOFOL POLYPECTOMY  Patient Location: Endoscopy Unit  Anesthesia Type:General  Level of Consciousness: awake, alert , and oriented  Airway & Oxygen Therapy: Patient Spontanous Breathing  Post-op Assessment: Report given to RN and Post -op Vital signs reviewed and stable  Post vital signs: Reviewed and stable  Last Vitals:  Vitals Value Taken Time  BP 111/84 12/28/23 0924  Temp 36.4 C 12/28/23 0922  Pulse 97 12/28/23 0923  Resp 19 12/28/23 0923  SpO2 93 % 12/28/23 0923  Vitals shown include unfiled device data.  Last Pain:  Vitals:   12/28/23 0922  TempSrc: Tympanic  PainSc: Asleep         Complications: No notable events documented.

## 2023-12-28 NOTE — H&P (Signed)
 Wyline Mood, MD [address], [address], [place], [place], [address] [address], [address], [place], [place], [address] Phone: [phone]  Fax: [phone]  Primary Care Physician:  Reubin Milan, MD   Pre-Procedure History & Physical: HPI:  Matthew Schaefer is a 63 y.o. male is here for an colonoscopy.   Past Medical History:  Diagnosis Date   Allergy    seasonal   Arthritis    spinal   Blood transfusion without reported diagnosis 12/2007   knee surgery   Essential hypertension 05/01/2015   Hypertension     Past Surgical History:  Procedure Laterality Date   COLONOSCOPY  11/10/2009   normal   COLONOSCOPY WITH PROPOFOL N/A 11/16/2020   Procedure: COLONOSCOPY WITH PROPOFOL;  Surgeon: Wyline Mood, MD;  Location: [facility] ENDOSCOPY;  Service: Gastroenterology;  Laterality: N/A;   ESOPHAGOGASTRODUODENOSCOPY  11/11/2011   HH, gastritis, H Pylori (-)   JOINT REPLACEMENT  12/2007   bilateral knee   REPLACEMENT TOTAL KNEE BILATERAL Bilateral 11/11/2007    Prior to Admission medications   Medication Sig Start Date End Date Taking? Authorizing Provider  fluticasone (FLONASE) 50 MCG/ACT nasal spray Place 2 sprays into both nostrils daily. 05/01/15   Reubin Milan, MD  gabapentin (NEURONTIN) 100 MG capsule Take 1 capsule (100 mg total) by mouth daily as needed. 10/12/23   Reubin Milan, MD  lisinopril (ZESTRIL) 20 MG tablet Take 1 tablet (20 mg total) by mouth daily. 10/12/23   Reubin Milan, MD  Loratadine (CLARITIN PO) Take 1 tablet by mouth as needed.    [provider]  Multiple Vitamin (MULTIVITAMIN) tablet Take 1 tablet by mouth daily.    [provider]  Semaglutide, 1 MG/DOSE, 2 MG/1.5ML SOPN Inject 1 mg into the skin once a week. May compound. 10/12/23   Reubin Milan, MD    Allergies as of 11/17/2023 - Review Complete 11/17/2023  Allergen Reaction Noted   Augmentin [amoxicillin-pot clavulanate] Palpitations 05/01/2015    Family History   Problem Relation Age of Onset   Diabetes Father    Pancreatic cancer Father    Stroke Father     Social History   Socioeconomic History   Marital status: Legally Separated    Spouse name: Not on file   Number of children: Not on file   Years of education: Not on file   Highest education level: Associate degree: occupational, Scientist, product/process development, or vocational program  Occupational History   Not on file  Tobacco Use   Smoking status: Never   Smokeless tobacco: Current    Types: Snuff  Vaping Use   Vaping status: Never Used  Substance and Sexual Activity   Alcohol use: No   Drug use: No   Sexual activity: Yes    Birth control/protection: None  Other Topics Concern   Not on file  Social History Narrative   Not on file   Social Drivers of Health   Financial Resource Strain: Not on file  Food Insecurity: No Food Insecurity (02/16/2023)   Hunger Vital Sign    Worried About Running Out of Food in the Last Year: Never true    Ran Out of Food in the Last Year: Never true  Transportation Needs: No Transportation Needs (02/16/2023)   PRAPARE - Administrator, Civil Service (Medical): No    Lack of Transportation (Non-Medical): No  Physical Activity: Insufficiently Active (02/16/2023)   Exercise Vital Sign    Days of Exercise per Week: 5 days  Minutes of Exercise per Session: 20 min  Stress: Stress Concern Present (02/16/2023)   [employer] of Occupational Health - Occupational Stress Questionnaire    Feeling of Stress : To some extent  Social Connections: Socially Isolated (02/16/2023)   Social Connection and Isolation Panel [NHANES]    Frequency of Communication with Friends and Family: More than three times a week    Frequency of Social Gatherings with Friends and Family: Once a week    Attends Religious Services: Never    Database administrator or Organizations: No    Attends Engineer, structural: Not on file    Marital Status: Separated  Intimate Partner  Violence: Not on file    Review of Systems: See HPI, otherwise negative ROS  Physical Exam: There were no vitals taken for this visit. General:   Alert,  pleasant and cooperative in NAD Head:  Normocephalic and atraumatic. Neck:  Supple; no masses or thyromegaly. Lungs:  Clear throughout to auscultation, normal respiratory effort.    Heart:  +S1, +S2, Regular rate and rhythm, No edema. Abdomen:  Soft, nontender and nondistended. Normal bowel sounds, without guarding, and without rebound.   Neurologic:  Alert and  oriented x4;  grossly normal neurologically.  Impression/Plan: Matthew Schaefer is here for an colonoscopy to be performed for surveillance due to prior history of colon polyps   Risks, benefits, limitations, and alternatives regarding  colonoscopy have been reviewed with the patient.  Questions have been answered.  All parties agreeable.   Wyline Mood, MD  12/28/2023, 8:20 AM

## 2023-12-28 NOTE — Anesthesia Procedure Notes (Signed)
 Date/Time: 12/28/2023 8:59 AM  Performed by: Ginger Carne, CRNAPre-anesthesia Checklist: Patient identified, Emergency Drugs available, Suction available, Patient being monitored and Timeout performed Patient Re-evaluated:Patient Re-evaluated prior to induction Oxygen Delivery Method: Simple face mask Preoxygenation: Pre-oxygenation with 100% oxygen

## 2023-12-28 NOTE — Op Note (Signed)
 [facility] Gastroenterology Patient Name: Matthew Schaefer Procedure Date: 12/28/2023 8:50 AM MRN: [id number] Account #: [id number] Date of Birth: [birth year] Admit Type: Outpatient Age: 63 Room: [facility] ENDO ROOM 1 Gender: Male Note Status: Finalized Instrument Name: Prentice Docker [id number] Procedure:             Colonoscopy Indications:           Surveillance: Personal history of adenomatous polyps                         on last colonoscopy > 3 years ago, Last colonoscopy:                         January 2022 Providers:             Wyline Mood MD, MD Referring MD:          Bari Edward, MD (Referring MD) Medicines:             Monitored Anesthesia Care Complications:         No immediate complications. Procedure:             Pre-Anesthesia Assessment:                        - Prior to the procedure, a History and Physical was                         performed, and patient medications, allergies and                         sensitivities were reviewed. The patient's tolerance                         of previous anesthesia was reviewed.                        - The risks and benefits of the procedure and the                         sedation options and risks were discussed with the                         patient. All questions were answered and informed                         consent was obtained.                        - ASA Grade Assessment: II - A patient with mild                         systemic disease.                        After obtaining informed consent, the colonoscope was                         passed under direct vision. Throughout the procedure,                         the patient's blood  pressure, pulse, and oxygen                         saturations were monitored continuously. The                         Colonoscope was introduced through the anus and                         advanced to the the cecum, identified by the                          appendiceal orifice. The colonoscopy was performed                         with ease. The patient tolerated the procedure well.                         The quality of the bowel preparation was adequate. The                         ileocecal valve, appendiceal orifice, and rectum were                         photographed. Findings:      The perianal and digital rectal examinations were normal.      Two sessile polyps were found in the descending colon and transverse       colon. The polyps were 4 to 5 mm in size. These polyps were removed with       a cold snare. Resection and retrieval were complete.      The exam was otherwise without abnormality on direct and retroflexion       views. Impression:            - Two 4 to 5 mm polyps in the descending colon and in                         the transverse colon, removed with a cold snare.                         Resected and retrieved.                        - The examination was otherwise normal on direct and                         retroflexion views. Recommendation:        - Discharge patient to home (with escort).                        - Resume previous diet.                        - Continue present medications.                        - Await pathology results.                        - Repeat colonoscopy in  5 years for surveillance. Procedure Code(s):     --- Professional ---                        [id number], Colonoscopy, flexible; with removal of                         tumor(s), polyp(s), or other lesion(s) by snare                         technique Diagnosis Code(s):     --- Professional ---                        Z86.010, Personal history of colonic polyps                        D12.4, Benign neoplasm of descending colon                        D12.3, Benign neoplasm of transverse colon (hepatic                         flexure or splenic flexure) CPT copyright 2022 American Medical Association. All rights reserved. The codes  documented in this report are preliminary and upon coder review may  be revised to meet current compliance requirements. Wyline Mood, MD Wyline Mood MD, MD 12/28/2023 9:18:26 AM This report has been signed electronically. Number of Addenda: 0 Note Initiated On: 12/28/2023 8:50 AM Scope Withdrawal Time: 0 hours 10 minutes 55 seconds  Total Procedure Duration: 0 hours 12 minutes 58 seconds  Estimated Blood Loss:  Estimated blood loss: none.      [facility]

## 2023-12-29 ENCOUNTER — Encounter: Payer: Self-pay | Admitting: Gastroenterology

## 2023-12-29 LAB — SURGICAL PATHOLOGY

## 2024-01-11 ENCOUNTER — Ambulatory Visit: Payer: Managed Care, Other (non HMO) | Admitting: Internal Medicine

## 2024-01-11 ENCOUNTER — Encounter: Payer: Self-pay | Admitting: Internal Medicine

## 2024-01-11 VITALS — BP 122/72 | HR 87 | Ht 72.0 in | Wt 337.2 lb

## 2024-01-11 DIAGNOSIS — I10 Essential (primary) hypertension: Secondary | ICD-10-CM

## 2024-01-11 DIAGNOSIS — Z23 Encounter for immunization: Secondary | ICD-10-CM | POA: Diagnosis not present

## 2024-01-11 DIAGNOSIS — Z6841 Body Mass Index (BMI) 40.0 and over, adult: Secondary | ICD-10-CM

## 2024-01-11 NOTE — Assessment & Plan Note (Addendum)
 Currently on Semaglutide for the past 7 months. He was losing steadily but gained 5 lbs since December due to a pause for colonoscopy. Resume medications and increase to 1.5 mg weekly

## 2024-01-11 NOTE — Assessment & Plan Note (Signed)
 Controlled BP with normal exam. Current regimen is lisinopril 20 mg. Will continue same medications; encourage continued reduced sodium diet.

## 2024-01-11 NOTE — Patient Instructions (Signed)
 You can increase the Semaglutide to 1.5 mg (30 units) weekly.

## 2024-01-11 NOTE — Progress Notes (Signed)
 Date:  01/11/2024   Name:  Matthew Schaefer   DOB:  10/09/1961   MRN:  [id number]   Chief Complaint: Hypertension  Hypertension This is a chronic problem. The problem is controlled. Associated symptoms include neck pain. Pertinent negatives include no chest pain, headaches, palpitations or shortness of breath. Past treatments include ACE inhibitors. The current treatment provides significant improvement. There is no history of kidney disease, CAD/MI or CVA.  Obesity - now on Semaglutide 1 mg.  Tolerating well.  Stopped for 3 weeks for colonoscopy and gained a few pounds.   He would like to try 1.5 mg if possible.  Review of Systems  Constitutional:  Negative for fatigue and unexpected weight change (steadily losing weight).  HENT:  Negative for nosebleeds.   Eyes:  Negative for visual disturbance.  Respiratory:  Negative for cough, chest tightness, shortness of breath and wheezing.   Cardiovascular:  Negative for chest pain, palpitations and leg swelling.  Gastrointestinal:  Negative for abdominal pain, constipation and diarrhea.  Musculoskeletal:  Positive for arthralgias and neck pain.  Neurological:  Negative for dizziness, weakness, light-headedness and headaches.  Psychiatric/Behavioral:  Negative for dysphoric mood and sleep disturbance. The patient is not nervous/anxious.      Lab Results  Component Value Date   NA 139 10/12/2023   K 4.5 10/12/2023   CO2 25 10/12/2023   GLUCOSE 97 10/12/2023   BUN 14 10/12/2023   CREATININE 1.02 10/12/2023   CALCIUM 9.2 10/12/2023   EGFR 82 10/12/2023   GFRNONAA 76 09/26/2020   Lab Results  Component Value Date   CHOL 162 10/12/2023   HDL 65 10/12/2023   LDLCALC 86 10/12/2023   TRIG 55 10/12/2023   CHOLHDL 2.5 10/12/2023   Lab Results  Component Value Date   TSH 1.200 10/12/2023   Lab Results  Component Value Date   HGBA1C 5.4 10/12/2023   Lab Results  Component Value Date   WBC 3.3 (L) 10/12/2023   HGB 15.6 10/12/2023    HCT 45.4 10/12/2023   MCV 89 10/12/2023   PLT 176 10/12/2023   Lab Results  Component Value Date   ALT 22 10/12/2023   AST 25 10/12/2023   ALKPHOS 74 10/12/2023   BILITOT 0.6 10/12/2023   No results found for: "25OHVITD2", "25OHVITD3", "VD25OH"   Patient Active Problem List   Diagnosis Date Noted   History of colonic polyps 12/28/2023   Adenomatous polyp of colon 12/28/2023   Heart palpitations 10/12/2023   OSA (obstructive sleep apnea) 02/19/2023   Prediabetes 10/07/2022   BMI 50.0-59.9, adult (HCC) 03/03/2016   Essential hypertension 05/01/2015   Environmental and seasonal allergies 05/01/2015   GERD without esophagitis 05/01/2015   Osteoarthritis of spine 05/01/2015    Allergies  Allergen Reactions   Augmentin [Amoxicillin-Pot Clavulanate] Palpitations    Past Surgical History:  Procedure Laterality Date   COLONOSCOPY  11/10/2009   normal   COLONOSCOPY WITH PROPOFOL N/A 11/16/2020   Procedure: COLONOSCOPY WITH PROPOFOL;  Surgeon: Wyline Mood, MD;  Location[facility] ENDOSCOPY;  Service: Gastroenterology;  Laterality: N/A;   COLONOSCOPY WITH PROPOFOL N/A 12/28/2023   Procedure: COLONOSCOPY WITH PROPOFOL;  Surgeon: Wyline Mood, MD;  Location: [facility] ENDOSCOPY;  Service: Gastroenterology;  Laterality: N/A;   ESOPHAGOGASTRODUODENOSCOPY  11/11/2011   HH, gastritis, H Pylori (-)   JOINT REPLACEMENT  12/2007   bilateral knee   POLYPECTOMY  12/28/2023   Procedure: POLYPECTOMY;  Surgeon: Wyline Mood, MD;  Location: [facility] ENDOSCOPY;  Service: Gastroenterology;;  REPLACEMENT TOTAL KNEE BILATERAL Bilateral 11/11/2007    Social History   Tobacco Use   Smoking status: Never   Smokeless tobacco: Current    Types: Snuff  Vaping Use   Vaping status: Never Used  Substance Use Topics   Alcohol use: No   Drug use: No     Medication list has been reviewed and updated.  Current Meds  Medication Sig   fluticasone (FLONASE) 50 MCG/ACT nasal spray Place 2 sprays into both  nostrils daily.   gabapentin (NEURONTIN) 100 MG capsule Take 1 capsule (100 mg total) by mouth daily as needed.   lisinopril (ZESTRIL) 20 MG tablet Take 1 tablet (20 mg total) by mouth daily.   Loratadine (CLARITIN PO) Take 1 tablet by mouth as needed.   Multiple Vitamin (MULTIVITAMIN) tablet Take 1 tablet by mouth daily.   Semaglutide, 1 MG/DOSE, 2 MG/1.5ML SOPN Inject 1 mg into the skin once a week. May compound.   [DISCONTINUED] Na Sulfate-K Sulfate-Mg Sulfate concentrate (SUPREP) 17.5-3.13-1.6 GM/177ML SOLN SMARTSIG:1 Kit(s) By Mouth Once       01/11/2024    1:21 PM 10/12/2023    8:01 AM 06/15/2023    2:56 PM 03/27/2023   10:17 AM  GAD 7 : Generalized Anxiety Score  Nervous, Anxious, on Edge 0 2 0 1  Control/stop worrying 0 2 0 0  Worry too much - different things 0 1 0 0  Trouble relaxing 0 1 0 0  Restless 0 0 0 0  Easily annoyed or irritable 0 0 0 0  Afraid - awful might happen 0 0 0 1  Total GAD 7 Score 0 6 0 2  Anxiety Difficulty Not difficult at all Not difficult at all Not difficult at all Not difficult at all       01/11/2024    1:20 PM 10/12/2023    8:01 AM 06/15/2023    2:56 PM  Depression screen PHQ 2/9  Decreased Interest 0 0 0  Down, Depressed, Hopeless 0 0 0  PHQ - 2 Score 0 0 0  Altered sleeping 0 0 0  Tired, decreased energy 0 0 0  Change in appetite 0 0 0  Feeling bad or failure about yourself  0 0 0  Trouble concentrating 0 0 0  Moving slowly or fidgety/restless 0 0 0  Suicidal thoughts 0 0 0  PHQ-9 Score 0 0 0  Difficult doing work/chores Not difficult at all Not difficult at all Not difficult at all    BP Readings from Last 3 Encounters:  01/11/24 122/72  12/28/23 120/72  10/12/23 128/76    Physical Exam Vitals and nursing note reviewed.  Constitutional:      General: He is not in acute distress.    Appearance: He is well-developed.  HENT:     Head: Normocephalic and atraumatic.  Cardiovascular:     Rate and Rhythm: Normal rate and regular  rhythm.  Pulmonary:     Effort: Pulmonary effort is normal. No respiratory distress.     Breath sounds: No wheezing or rhonchi.  Musculoskeletal:     Cervical back: Normal range of motion.     Right lower leg: No edema.     Left lower leg: No edema.  Lymphadenopathy:     Cervical: No cervical adenopathy.  Skin:    General: Skin is warm and dry.     Findings: No rash.  Neurological:     Mental Status: He is alert and oriented to person, place, and time.  Psychiatric:        Mood and Affect: Mood normal.        Behavior: Behavior normal.     Wt Readings from Last 3 Encounters:  01/11/24 (!) 337 lb 4 oz (153 kg)  12/28/23 (!) 327 lb 14.4 oz (148.7 kg)  10/12/23 (!) 332 lb (150.6 kg)    BP 122/72   Pulse 87   Ht 6' (1.829 m)   Wt (!) 337 lb 4 oz (153 kg)   SpO2 99%   BMI 45.74 kg/m   Assessment and Plan:  Problem List Items Addressed This Visit       Unprioritized   Essential hypertension - Primary (Chronic)   Controlled BP with normal exam. Current regimen is lisinopril 20 mg. Will continue same medications; encourage continued reduced sodium diet.       BMI 50.0-59.9, adult [facility])   Currently on Semaglutide for the past 7 months. He was losing steadily but gained 5 lbs since December due to a pause for colonoscopy. Resume medications and increase to 1.5 mg weekly      Other Visit Diagnoses       Immunization due       Relevant Orders   Tdap vaccine greater than or equal to 7yo IM       Return in about 3 months (around 04/12/2024) for HTN, weight.    Reubin Milan, MD [facility] [place]

## 2024-04-18 ENCOUNTER — Encounter: Payer: Self-pay | Admitting: Internal Medicine

## 2024-04-18 ENCOUNTER — Ambulatory Visit: Admitting: Internal Medicine

## 2024-04-18 VITALS — BP 124/72 | HR 88 | Ht 72.0 in | Wt 332.5 lb

## 2024-04-18 DIAGNOSIS — G4733 Obstructive sleep apnea (adult) (pediatric): Secondary | ICD-10-CM

## 2024-04-18 DIAGNOSIS — Z6841 Body Mass Index (BMI) 40.0 and over, adult: Secondary | ICD-10-CM

## 2024-04-18 DIAGNOSIS — I10 Essential (primary) hypertension: Secondary | ICD-10-CM

## 2024-04-18 MED ORDER — LISINOPRIL 20 MG PO TABS: 20.0000 mg | ORAL_TABLET | Freq: Every day | ORAL | 1 refills | Status: DC

## 2024-04-18 MED ORDER — TIRZEPATIDE-WEIGHT MANAGEMENT 2.5 MG/0.5ML ~~LOC~~ SOLN: 2.5000 mg | SUBCUTANEOUS | 0 refills | Status: DC

## 2024-04-18 NOTE — Assessment & Plan Note (Signed)
 Blood pressure is well controlled.  Current medications lisinopril. Will continue same regimen along with efforts to limit dietary sodium.

## 2024-04-18 NOTE — Progress Notes (Signed)
 Date:  04/18/2024   Name:  Matthew Schaefer   DOB:  Nov 22, 1960   MRN:  [id number]   Chief Complaint: Hypertension and Weight Check  Hypertension This is a chronic problem. The problem is controlled. Pertinent negatives include no chest pain, headaches, palpitations or shortness of breath. Past treatments include ACE inhibitors. The current treatment provides significant improvement.  Weight management - pt is here for weight management follow up.  Started on Semaglutide  on Dec 2024.  Doing well without side effects.  Starting weight 337 lbs.   Today's weight 332 lbs.  Current diet is low carb and current exercise routine is three times per week. He is not noticing any weight loss or change in appetite.   Review of Systems  Constitutional:  Negative for fatigue and unexpected weight change.  HENT:  Negative for nosebleeds.   Eyes:  Negative for visual disturbance.  Respiratory:  Negative for cough, chest tightness, shortness of breath and wheezing.   Cardiovascular:  Negative for chest pain, palpitations and leg swelling.  Gastrointestinal:  Positive for abdominal pain (occaisonal mild brief episodes of upper abd discomfort). Negative for constipation, diarrhea, nausea and vomiting.  Neurological:  Negative for dizziness, weakness, light-headedness and headaches.  Psychiatric/Behavioral:  Negative for dysphoric mood. The patient is not nervous/anxious.      Lab Results  Component Value Date   NA 139 10/12/2023   K 4.5 10/12/2023   CO2 25 10/12/2023   GLUCOSE 97 10/12/2023   BUN 14 10/12/2023   CREATININE 1.02 10/12/2023   CALCIUM 9.2 10/12/2023   EGFR 82 10/12/2023   GFRNONAA 76 09/26/2020   Lab Results  Component Value Date   CHOL 162 10/12/2023   HDL 65 10/12/2023   LDLCALC 86 10/12/2023   TRIG 55 10/12/2023   CHOLHDL 2.5 10/12/2023   Lab Results  Component Value Date   TSH 1.200 10/12/2023   Lab Results  Component Value Date   HGBA1C 5.4 10/12/2023   Lab Results   Component Value Date   WBC 3.3 (L) 10/12/2023   HGB 15.6 10/12/2023   HCT 45.4 10/12/2023   MCV 89 10/12/2023   PLT 176 10/12/2023   Lab Results  Component Value Date   ALT 22 10/12/2023   AST 25 10/12/2023   ALKPHOS 74 10/12/2023   BILITOT 0.6 10/12/2023   No results found for: "25OHVITD2", "25OHVITD3", "VD25OH"   Patient Active Problem List   Diagnosis Date Noted   History of colonic polyps 12/28/2023   Adenomatous polyp of colon 12/28/2023   Heart palpitations 10/12/2023   OSA (obstructive sleep apnea) 02/19/2023   Prediabetes 10/07/2022   BMI 50.0-59.9, adult (HCC) 03/03/2016   Essential hypertension 05/01/2015   Environmental and seasonal allergies 05/01/2015   GERD without esophagitis 05/01/2015   Osteoarthritis of spine 05/01/2015    Allergies  Allergen Reactions   Augmentin [Amoxicillin -Pot Clavulanate] Palpitations    Past Surgical History:  Procedure Laterality Date   COLONOSCOPY  11/10/2009   normal   COLONOSCOPY WITH PROPOFOL  N/A 11/16/2020   Procedure: COLONOSCOPY WITH PROPOFOL ;  Surgeon: Luke Salaam, MD;  Location: [facility] ENDOSCOPY;  Service: Gastroenterology;  Laterality: N/A;   COLONOSCOPY WITH PROPOFOL  N/A 12/28/2023   Procedure: COLONOSCOPY WITH PROPOFOL ;  Surgeon: Luke Salaam, MD;  Location: [facility] ENDOSCOPY;  Service: Gastroenterology;  Laterality: N/A;   ESOPHAGOGASTRODUODENOSCOPY  11/11/2011   HH, gastritis, H Pylori (-)   JOINT REPLACEMENT  12/2007   bilateral knee   POLYPECTOMY  12/28/2023   Procedure: POLYPECTOMY;  Surgeon: Luke Salaam, MD;  Location: [facility] ENDOSCOPY;  Service: Gastroenterology;;   REPLACEMENT TOTAL KNEE BILATERAL Bilateral 11/11/2007    Social History   Tobacco Use   Smoking status: Never   Smokeless tobacco: Current    Types: Snuff  Vaping Use   Vaping status: Never Used  Substance Use Topics   Alcohol use: No   Drug use: No     Medication list has been reviewed and updated.  Current Meds  Medication Sig    fluticasone  (FLONASE ) 50 MCG/ACT nasal spray Place 2 sprays into both nostrils daily.   gabapentin  (NEURONTIN ) 100 MG capsule Take 1 capsule (100 mg total) by mouth daily as needed.   Loratadine (CLARITIN PO) Take 1 tablet by mouth as needed.   Multiple Vitamin (MULTIVITAMIN) tablet Take 1 tablet by mouth daily.   tirzepatide (ZEPBOUND) 2.5 MG/0.5ML injection vial Inject 2.5 mg into the skin once a week.   [DISCONTINUED] lisinopril  (ZESTRIL ) 20 MG tablet Take 1 tablet (20 mg total) by mouth daily.   [DISCONTINUED] Semaglutide , 1 MG/DOSE, 2 MG/1.5ML SOPN Inject 1 mg into the skin once a week. May compound.       04/18/2024    9:20 AM 01/11/2024    1:21 PM 10/12/2023    8:01 AM 06/15/2023    2:56 PM  GAD 7 : Generalized Anxiety Score  Nervous, Anxious, on Edge 0 0 2 0  Control/stop worrying 0 0 2 0  Worry too much - different things 0 0 1 0  Trouble relaxing 0 0 1 0  Restless 0 0 0 0  Easily annoyed or irritable 0 0 0 0  Afraid - awful might happen 0 0 0 0  Total GAD 7 Score 0 0 6 0  Anxiety Difficulty Not difficult at all Not difficult at all Not difficult at all Not difficult at all       04/18/2024    9:20 AM 01/11/2024    1:20 PM 10/12/2023    8:01 AM  Depression screen PHQ 2/9  Decreased Interest 0 0 0  Down, Depressed, Hopeless 0 0 0  PHQ - 2 Score 0 0 0  Altered sleeping 0 0 0  Tired, decreased energy 0 0 0  Change in appetite 0 0 0  Feeling bad or failure about yourself  0 0 0  Trouble concentrating 0 0 0  Moving slowly or fidgety/restless 0 0 0  Suicidal thoughts 0 0 0  PHQ-9 Score 0 0 0  Difficult doing work/chores Not difficult at all Not difficult at all Not difficult at all    BP Readings from Last 3 Encounters:  04/18/24 124/72  01/11/24 122/72  12/28/23 120/72    Physical Exam Vitals and nursing note reviewed.  Constitutional:      General: He is not in acute distress.    Appearance: He is well-developed.  HENT:     Head: Normocephalic and atraumatic.   Cardiovascular:     Rate and Rhythm: Normal rate and regular rhythm.  Pulmonary:     Effort: Pulmonary effort is normal. No respiratory distress.     Breath sounds: No wheezing or rhonchi.  Abdominal:     Palpations: Abdomen is soft.     Tenderness: There is no abdominal tenderness.  Musculoskeletal:     Right lower leg: No edema.     Left lower leg: No edema.  Skin:    General: Skin is warm and dry.     Findings: No rash.  Neurological:  General: No focal deficit present.     Mental Status: He is alert and oriented to person, place, and time.  Psychiatric:        Mood and Affect: Mood normal.        Behavior: Behavior normal.     Wt Readings from Last 3 Encounters:  04/18/24 (!) 332 lb 8 oz (150.8 kg)  01/11/24 (!) 337 lb 4 oz (153 kg)  12/28/23 (!) 327 lb 14.4 oz (148.7 kg)    BP 124/72   Pulse 88   Ht 6' (1.829 m)   Wt (!) 332 lb 8 oz (150.8 kg)   SpO2 97%   BMI 45.10 kg/m   Assessment and Plan:  Problem List Items Addressed This Visit       Unprioritized   Essential hypertension - Primary (Chronic)   Blood pressure is well controlled.  Current medications lisinopril  Will continue same regimen along with efforts to limit dietary sodium.       Relevant Medications   lisinopril  (ZESTRIL ) 20 MG tablet   BMI 50.0-59.9, adult (HCC)   He has been on Ozempic  without much benefit for several months. Will change to Zepbound through [place] direct. Follow up in 3 months.      Relevant Medications   tirzepatide (ZEPBOUND) 2.5 MG/0.5ML injection vial   OSA (obstructive sleep apnea)   Doing well on CPAP nightly.       Return in about 3 months (around 07/19/2024) for HTN.    Sheron Dixons, MD [facility] [place]

## 2024-04-18 NOTE — Assessment & Plan Note (Signed)
 Doing well on CPAP nightly.

## 2024-04-18 NOTE — Assessment & Plan Note (Signed)
 He has been on Ozempic  without much benefit for several months. Will change to Zepbound through [place] direct. Follow up in 3 months.

## 2024-05-16 ENCOUNTER — Encounter: Payer: Self-pay | Admitting: Internal Medicine

## 2024-05-17 ENCOUNTER — Other Ambulatory Visit: Payer: Self-pay

## 2024-05-17 MED ORDER — TIRZEPATIDE-WEIGHT MANAGEMENT 5 MG/0.5ML ~~LOC~~ SOLN: 5.0000 mg | SUBCUTANEOUS | 0 refills | Status: DC

## 2024-06-13 ENCOUNTER — Encounter: Payer: Self-pay | Admitting: Internal Medicine

## 2024-06-14 ENCOUNTER — Other Ambulatory Visit: Payer: Self-pay

## 2024-06-14 MED ORDER — TIRZEPATIDE-WEIGHT MANAGEMENT 5 MG/0.5ML ~~LOC~~ SOLN: 5.0000 mg | SUBCUTANEOUS | 0 refills | Status: DC

## 2024-07-18 ENCOUNTER — Ambulatory Visit: Admitting: Internal Medicine

## 2024-07-18 VITALS — BP 126/74 | HR 91 | Ht 72.0 in | Wt 317.0 lb

## 2024-07-18 DIAGNOSIS — Z6841 Body Mass Index (BMI) 40.0 and over, adult: Secondary | ICD-10-CM | POA: Diagnosis not present

## 2024-07-18 DIAGNOSIS — S161XXA Strain of muscle, fascia and tendon at neck level, initial encounter: Secondary | ICD-10-CM | POA: Insufficient documentation

## 2024-07-18 DIAGNOSIS — I10 Essential (primary) hypertension: Secondary | ICD-10-CM

## 2024-07-18 DIAGNOSIS — S161XXD Strain of muscle, fascia and tendon at neck level, subsequent encounter: Secondary | ICD-10-CM | POA: Diagnosis not present

## 2024-07-18 DIAGNOSIS — G4733 Obstructive sleep apnea (adult) (pediatric): Secondary | ICD-10-CM

## 2024-07-18 MED ORDER — TIRZEPATIDE-WEIGHT MANAGEMENT 7.5 MG/0.5ML ~~LOC~~ SOLN: 7.5000 mg | SUBCUTANEOUS | 0 refills | Status: DC

## 2024-07-18 NOTE — Assessment & Plan Note (Signed)
 Nightly use with good benefit. Losing weight on Zepbound  now and continuing to exercise.

## 2024-07-18 NOTE — Assessment & Plan Note (Signed)
 Good response to Zepbound , now on 5 mg. Will increase to 7.5 mg. Follow up in December.

## 2024-07-18 NOTE — Assessment & Plan Note (Signed)
 Blood pressure is well controlled on lisinopril . No medication side effects noted. Plan to continue current medications.

## 2024-07-18 NOTE — Progress Notes (Signed)
 Date:  07/18/2024   Name:  Matthew Schaefer   DOB:  [birth year]   MRN:  [id number]   Chief Complaint: Hypertension  Hypertension This is a chronic problem. The problem is controlled. Pertinent negatives include no chest pain, headaches, palpitations or shortness of breath. Past treatments include angiotensin blockers.  Obesity - he is on Mounjaro  5 mg without side effects since May.  He did not have any weight loss benefit from Wegovy .  Review of Systems  Constitutional:  Negative for fatigue and unexpected weight change.  HENT:  Negative for nosebleeds.   Eyes:  Negative for visual disturbance.  Respiratory:  Negative for cough, chest tightness, shortness of breath and wheezing.   Cardiovascular:  Negative for chest pain, palpitations and leg swelling.  Gastrointestinal:  Negative for abdominal pain, constipation and diarrhea.  Musculoskeletal:  Positive for arthralgias and neck stiffness.  Neurological:  Negative for dizziness, weakness, light-headedness and headaches.  Psychiatric/Behavioral:  Negative for dysphoric mood and sleep disturbance. The patient is not nervous/anxious.      Lab Results  Component Value Date   NA 139 10/12/2023   K 4.5 10/12/2023   CO2 25 10/12/2023   GLUCOSE 97 10/12/2023   BUN 14 10/12/2023   CREATININE 1.02 10/12/2023   CALCIUM 9.2 10/12/2023   EGFR 82 10/12/2023   GFRNONAA 76 09/26/2020   Lab Results  Component Value Date   CHOL 162 10/12/2023   HDL 65 10/12/2023   LDLCALC 86 10/12/2023   TRIG 55 10/12/2023   CHOLHDL 2.5 10/12/2023   Lab Results  Component Value Date   TSH 1.200 10/12/2023   Lab Results  Component Value Date   HGBA1C 5.4 10/12/2023   Lab Results  Component Value Date   WBC 3.3 (L) 10/12/2023   HGB 15.6 10/12/2023   HCT 45.4 10/12/2023   MCV 89 10/12/2023   PLT 176 10/12/2023   Lab Results  Component Value Date   ALT 22 10/12/2023   AST 25 10/12/2023   ALKPHOS 74 10/12/2023   BILITOT 0.6 10/12/2023   No  results found for: MARIEN BOLLS, VD25OH   Patient Active Problem List   Diagnosis Date Noted   Cervical strain 07/18/2024   History of colonic polyps 12/28/2023   Adenomatous polyp of colon 12/28/2023   Heart palpitations 10/12/2023   OSA (obstructive sleep apnea) 02/19/2023   Prediabetes 10/07/2022   BMI 50.0-59.9, adult (HCC) 03/03/2016   Essential hypertension 05/01/2015   Environmental and seasonal allergies 05/01/2015   GERD without esophagitis 05/01/2015   Osteoarthritis of spine 05/01/2015    Allergies  Allergen Reactions   Augmentin [Amoxicillin -Pot Clavulanate] Palpitations    Past Surgical History:  Procedure Laterality Date   COLONOSCOPY  11/10/2009   normal   COLONOSCOPY WITH PROPOFOL  N/A 11/16/2020   Procedure: COLONOSCOPY WITH PROPOFOL ;  Surgeon: Therisa Bi, MD;  Location: [facility] ENDOSCOPY;  Service: Gastroenterology;  Laterality: N/A;   COLONOSCOPY WITH PROPOFOL  N/A 12/28/2023   Procedure: COLONOSCOPY WITH PROPOFOL ;  Surgeon: Therisa Bi, MD;  Location: [facility] ENDOSCOPY;  Service: Gastroenterology;  Laterality: N/A;   ESOPHAGOGASTRODUODENOSCOPY  11/11/2011   HH, gastritis, H Pylori (-)   JOINT REPLACEMENT  12/2007   bilateral knee   POLYPECTOMY  12/28/2023   Procedure: POLYPECTOMY;  Surgeon: Therisa Bi, MD;  Location: [facility] ENDOSCOPY;  Service: Gastroenterology;;   REPLACEMENT TOTAL KNEE BILATERAL Bilateral 11/11/2007    Social History   Tobacco Use   Smoking status: Never   Smokeless tobacco: Current  Types: Snuff  Vaping Use   Vaping status: Never Used  Substance Use Topics   Alcohol use: No   Drug use: No     Medication list has been reviewed and updated.  Current Meds  Medication Sig   fluticasone  (FLONASE ) 50 MCG/ACT nasal spray Place 2 sprays into both nostrils daily.   gabapentin  (NEURONTIN ) 100 MG capsule Take 1 capsule (100 mg total) by mouth daily as needed.   lisinopril  (ZESTRIL ) 20 MG tablet Take 1 tablet (20 mg total)  by mouth daily.   Loratadine (CLARITIN PO) Take 1 tablet by mouth as needed.   Multiple Vitamin (MULTIVITAMIN) tablet Take 1 tablet by mouth daily.   tirzepatide  7.5 MG/0.5ML injection vial Inject 7.5 mg into the skin once a week.   [DISCONTINUED] tirzepatide  5 MG/0.5ML injection vial Inject 5 mg into the skin once a week.       07/18/2024    2:21 PM 04/18/2024    9:20 AM 01/11/2024    1:21 PM 10/12/2023    8:01 AM  GAD 7 : Generalized Anxiety Score  Nervous, Anxious, on Edge 0 0 0 2  Control/stop worrying 0 0 0 2  Worry too much - different things 0 0 0 1  Trouble relaxing 0 0 0 1  Restless 0 0 0 0  Easily annoyed or irritable 0 0 0 0  Afraid - awful might happen 0 0 0 0  Total GAD 7 Score 0 0 0 6  Anxiety Difficulty Not difficult at all Not difficult at all Not difficult at all Not difficult at all       07/18/2024    2:21 PM 04/18/2024    9:20 AM 01/11/2024    1:20 PM  Depression screen PHQ 2/9  Decreased Interest 0 0 0  Down, Depressed, Hopeless 0 0 0  PHQ - 2 Score 0 0 0  Altered sleeping 0 0 0  Tired, decreased energy 0 0 0  Change in appetite 0 0 0  Feeling bad or failure about yourself  0 0 0  Trouble concentrating 0 0 0  Moving slowly or fidgety/restless 0 0 0  Suicidal thoughts 0 0 0  PHQ-9 Score 0 0 0  Difficult doing work/chores Not difficult at all Not difficult at all Not difficult at all    BP Readings from Last 3 Encounters:  07/18/24 126/74  04/18/24 124/72  01/11/24 122/72    Physical Exam Vitals and nursing note reviewed.  Constitutional:      General: He is not in acute distress.    Appearance: He is well-developed.  HENT:     Head: Normocephalic and atraumatic.  Neck:     Vascular: No carotid bruit.  Cardiovascular:     Rate and Rhythm: Normal rate and regular rhythm.     Heart sounds: No murmur heard. Pulmonary:     Effort: Pulmonary effort is normal. No respiratory distress.     Breath sounds: No wheezing or rhonchi.  Musculoskeletal:      Cervical back: Normal range of motion. No rigidity or tenderness.  Lymphadenopathy:     Cervical: No cervical adenopathy.  Skin:    General: Skin is warm and dry.     Findings: No rash.  Neurological:     General: No focal deficit present.     Mental Status: He is alert and oriented to person, place, and time.  Psychiatric:        Mood and Affect: Mood normal.  Behavior: Behavior normal.     Wt Readings from Last 3 Encounters:  07/18/24 (!) 317 lb (143.8 kg)  04/18/24 (!) 332 lb 8 oz (150.8 kg)  01/11/24 (!) 337 lb 4 oz (153 kg)    BP 126/74   Pulse 91   Ht 6' (1.829 m)   Wt (!) 317 lb (143.8 kg)   SpO2 97%   BMI 42.99 kg/m   Assessment and Plan:  Problem List Items Addressed This Visit       Unprioritized   Essential hypertension - Primary (Chronic)   Blood pressure is well controlled on lisinopril . No medication side effects noted. Plan to continue current medications.       BMI 50.0-59.9, adult (HCC)   Good response to Zepbound , now on 5 mg. Will increase to 7.5 mg. Follow up in December.      Relevant Medications   tirzepatide  7.5 MG/0.5ML injection vial   OSA (obstructive sleep apnea)   Nightly use with good benefit. Losing weight on Zepbound  now and continuing to exercise.      Cervical strain   Continue heat, ibuprofen, stretching Consider PTx or Chiropractic care       No follow-ups on file.    Leita HILARIO Adie, MD [facility] [place]

## 2024-07-18 NOTE — Assessment & Plan Note (Signed)
 Continue heat, ibuprofen, stretching Consider PTx or Chiropractic care

## 2024-08-22 ENCOUNTER — Encounter: Payer: Self-pay | Admitting: Internal Medicine

## 2024-08-25 ENCOUNTER — Other Ambulatory Visit: Payer: Self-pay

## 2024-08-25 DIAGNOSIS — Z6841 Body Mass Index (BMI) 40.0 and over, adult: Secondary | ICD-10-CM

## 2024-08-25 MED ORDER — TIRZEPATIDE-WEIGHT MANAGEMENT 7.5 MG/0.5ML ~~LOC~~ SOLN: 7.5000 mg | SUBCUTANEOUS | 0 refills | Status: DC

## 2024-09-26 ENCOUNTER — Other Ambulatory Visit: Payer: Self-pay | Admitting: Internal Medicine

## 2024-09-26 ENCOUNTER — Encounter: Payer: Self-pay | Admitting: Internal Medicine

## 2024-09-26 DIAGNOSIS — Z6841 Body Mass Index (BMI) 40.0 and over, adult: Secondary | ICD-10-CM

## 2024-09-26 MED ORDER — TIRZEPATIDE-WEIGHT MANAGEMENT 10 MG/0.5ML ~~LOC~~ SOLN: 10.0000 mg | SUBCUTANEOUS | 0 refills | Status: DC

## 2024-09-26 NOTE — Progress Notes (Unsigned)
 Date:  09/26/2024   Name:  Matthew Schaefer   DOB:  03/07/1961   MRN:  [id number]   Chief Complaint: No chief complaint on file.  HPI  Review of Systems   Lab Results  Component Value Date   NA 139 10/12/2023   K 4.5 10/12/2023   CO2 25 10/12/2023   GLUCOSE 97 10/12/2023   BUN 14 10/12/2023   CREATININE 1.02 10/12/2023   CALCIUM 9.2 10/12/2023   EGFR 82 10/12/2023   GFRNONAA 76 09/26/2020   Lab Results  Component Value Date   CHOL 162 10/12/2023   HDL 65 10/12/2023   LDLCALC 86 10/12/2023   TRIG 55 10/12/2023   CHOLHDL 2.5 10/12/2023   Lab Results  Component Value Date   TSH 1.200 10/12/2023   Lab Results  Component Value Date   HGBA1C 5.4 10/12/2023   Lab Results  Component Value Date   WBC 3.3 (L) 10/12/2023   HGB 15.6 10/12/2023   HCT 45.4 10/12/2023   MCV 89 10/12/2023   PLT 176 10/12/2023   Lab Results  Component Value Date   ALT 22 10/12/2023   AST 25 10/12/2023   ALKPHOS 74 10/12/2023   BILITOT 0.6 10/12/2023   No results found for: MARIEN BOLLS, VD25OH   Patient Active Problem List   Diagnosis Date Noted   Cervical strain 07/18/2024   History of colonic polyps 12/28/2023   Adenomatous polyp of colon 12/28/2023   Heart palpitations 10/12/2023   OSA (obstructive sleep apnea) 02/19/2023   Prediabetes 10/07/2022   BMI 50.0-59.9, adult (HCC) 03/03/2016   Essential hypertension 05/01/2015   Environmental and seasonal allergies 05/01/2015   GERD without esophagitis 05/01/2015   Osteoarthritis of spine 05/01/2015    Allergies  Allergen Reactions   Augmentin [Amoxicillin -Pot Clavulanate] Palpitations    Past Surgical History:  Procedure Laterality Date   COLONOSCOPY  11/10/2009   normal   COLONOSCOPY WITH PROPOFOL  N/A 11/16/2020   Procedure: COLONOSCOPY WITH PROPOFOL ;  Surgeon: Therisa Bi, MD;  Location: [facility] ENDOSCOPY;  Service: Gastroenterology;  Laterality: N/A;   COLONOSCOPY WITH PROPOFOL  N/A 12/28/2023   Procedure:  COLONOSCOPY WITH PROPOFOL ;  Surgeon: Therisa Bi, MD;  Location: [facility] ENDOSCOPY;  Service: Gastroenterology;  Laterality: N/A;   ESOPHAGOGASTRODUODENOSCOPY  11/11/2011   HH, gastritis, H Pylori (-)   JOINT REPLACEMENT  12/2007   bilateral knee   POLYPECTOMY  12/28/2023   Procedure: POLYPECTOMY;  Surgeon: Therisa Bi, MD;  Location: [facility] ENDOSCOPY;  Service: Gastroenterology;;   REPLACEMENT TOTAL KNEE BILATERAL Bilateral 11/11/2007    Social History   Tobacco Use   Smoking status: Never   Smokeless tobacco: Current    Types: Snuff  Vaping Use   Vaping status: Never Used  Substance Use Topics   Alcohol use: No   Drug use: No     Medication list has been reviewed and updated.  No outpatient medications have been marked as taking for the 09/26/24 encounter (Orders Only) with Justus Leita DEL, MD.       07/18/2024    2:21 PM 04/18/2024    9:20 AM 01/11/2024    1:21 PM 10/12/2023    8:01 AM  GAD 7 : Generalized Anxiety Score  Nervous, Anxious, on Edge 0 0 0 2  Control/stop worrying 0 0 0 2  Worry too much - different things 0 0 0 1  Trouble relaxing 0 0 0 1  Restless 0 0 0 0  Easily annoyed or irritable 0 0 0 0  Afraid - awful might happen 0 0 0 0  Total GAD 7 Score 0 0 0 6  Anxiety Difficulty Not difficult at all Not difficult at all Not difficult at all Not difficult at all       07/18/2024    2:21 PM 04/18/2024    9:20 AM 01/11/2024    1:20 PM  Depression screen PHQ 2/9  Decreased Interest 0 0 0  Down, Depressed, Hopeless 0 0 0  PHQ - 2 Score 0 0 0  Altered sleeping 0 0 0  Tired, decreased energy 0 0 0  Change in appetite 0 0 0  Feeling bad or failure about yourself  0 0 0  Trouble concentrating 0 0 0  Moving slowly or fidgety/restless 0 0 0  Suicidal thoughts 0 0 0  PHQ-9 Score 0  0  0   Difficult doing work/chores Not difficult at all Not difficult at all Not difficult at all     Data saved with a previous flowsheet row definition    BP Readings from Last 3  Encounters:  07/18/24 126/74  04/18/24 124/72  01/11/24 122/72    Physical Exam  Wt Readings from Last 3 Encounters:  07/18/24 (!) 317 lb (143.8 kg)  04/18/24 (!) 332 lb 8 oz (150.8 kg)  01/11/24 (!) 337 lb 4 oz (153 kg)    There were no vitals taken for this visit.  Assessment and Plan:  Problem List Items Addressed This Visit   None   No follow-ups on file.    Leita HILARIO Adie, MD [facility] [place]

## 2024-09-26 NOTE — Telephone Encounter (Signed)
 Please review.  KP

## 2024-10-07 ENCOUNTER — Other Ambulatory Visit: Payer: Self-pay | Admitting: Internal Medicine

## 2024-10-07 DIAGNOSIS — I10 Essential (primary) hypertension: Secondary | ICD-10-CM

## 2024-10-11 NOTE — Telephone Encounter (Signed)
 Requested by interface surescripts. Future visit in 2 weeks.  Requested Prescriptions  Pending Prescriptions Disp Refills   lisinopril  (ZESTRIL ) 20 MG tablet [Pharmacy Med Name: Lisinopril  20 MG Oral Tablet] 90 tablet 0    Sig: Take 1 tablet by mouth once daily     Cardiovascular:  ACE Inhibitors Failed - 10/11/2024  1:45 PM      Failed - Cr in normal range and within 180 days    Creatinine, Ser  Date Value Ref Range Status  10/12/2023 1.02 0.76 - 1.27 mg/dL Final         Failed - K in normal range and within 180 days    Potassium  Date Value Ref Range Status  10/12/2023 4.5 3.5 - 5.2 mmol/L Final         Passed - Patient is not pregnant      Passed - Last BP in normal range    BP Readings from Last 1 Encounters:  07/18/24 126/74         Passed - Valid encounter within last 6 months    Recent Outpatient Visits           2 months ago Essential hypertension   [place] [facility] at [facility], Leita DEL, MD   5 months ago Essential hypertension   [place] [facility] at [facility], Leita DEL, MD   9 months ago Essential hypertension   [facility] at [facility], Leita DEL, MD       Future Appointments             In 2 weeks Justus, Leita DEL, MD [facility] at [facility], [phone] [place]

## 2024-10-24 ENCOUNTER — Other Ambulatory Visit: Payer: Self-pay | Admitting: Internal Medicine

## 2024-10-24 ENCOUNTER — Encounter: Payer: Self-pay | Admitting: Internal Medicine

## 2024-10-24 DIAGNOSIS — Z6841 Body Mass Index (BMI) 40.0 and over, adult: Secondary | ICD-10-CM

## 2024-10-24 MED ORDER — TIRZEPATIDE-WEIGHT MANAGEMENT 10 MG/0.5ML ~~LOC~~ SOLN: 10.0000 mg | SUBCUTANEOUS | 0 refills | Status: DC

## 2024-10-24 NOTE — Telephone Encounter (Signed)
 Please review.  KP

## 2024-10-24 NOTE — Progress Notes (Unsigned)
 Date:  10/24/2024   Name:  Matthew Schaefer   DOB:  10-01-1961   MRN:  [id number]   Chief Complaint: No chief complaint on file.  HPI  Review of Systems   Lab Results  Component Value Date   NA 139 10/12/2023   K 4.5 10/12/2023   CO2 25 10/12/2023   GLUCOSE 97 10/12/2023   BUN 14 10/12/2023   CREATININE 1.02 10/12/2023   CALCIUM 9.2 10/12/2023   EGFR 82 10/12/2023   GFRNONAA 76 09/26/2020   Lab Results  Component Value Date   CHOL 162 10/12/2023   HDL 65 10/12/2023   LDLCALC 86 10/12/2023   TRIG 55 10/12/2023   CHOLHDL 2.5 10/12/2023   Lab Results  Component Value Date   TSH 1.200 10/12/2023   Lab Results  Component Value Date   HGBA1C 5.4 10/12/2023   Lab Results  Component Value Date   WBC 3.3 (L) 10/12/2023   HGB 15.6 10/12/2023   HCT 45.4 10/12/2023   MCV 89 10/12/2023   PLT 176 10/12/2023   Lab Results  Component Value Date   ALT 22 10/12/2023   AST 25 10/12/2023   ALKPHOS 74 10/12/2023   BILITOT 0.6 10/12/2023   No results found for: MARIEN BOLLS, VD25OH   Patient Active Problem List   Diagnosis Date Noted   Cervical strain 07/18/2024   History of colonic polyps 12/28/2023   Adenomatous polyp of colon 12/28/2023   Heart palpitations 10/12/2023   OSA (obstructive sleep apnea) 02/19/2023   Prediabetes 10/07/2022   BMI 50.0-59.9, adult (HCC) 03/03/2016   Essential hypertension 05/01/2015   Environmental and seasonal allergies 05/01/2015   GERD without esophagitis 05/01/2015   Osteoarthritis of spine 05/01/2015    Allergies[1]  Past Surgical History:  Procedure Laterality Date   COLONOSCOPY  11/10/2009   normal   COLONOSCOPY WITH PROPOFOL  N/A 11/16/2020   Procedure: COLONOSCOPY WITH PROPOFOL ;  Surgeon: Therisa Bi, MD;  Location: [facility] ENDOSCOPY;  Service: Gastroenterology;  Laterality: N/A;   COLONOSCOPY WITH PROPOFOL  N/A 12/28/2023   Procedure: COLONOSCOPY WITH PROPOFOL ;  Surgeon: Therisa Bi, MD;  Location: [facility]  ENDOSCOPY;  Service: Gastroenterology;  Laterality: N/A;   ESOPHAGOGASTRODUODENOSCOPY  11/11/2011   HH, gastritis, H Pylori (-)   JOINT REPLACEMENT  12/2007   bilateral knee   POLYPECTOMY  12/28/2023   Procedure: POLYPECTOMY;  Surgeon: Therisa Bi, MD;  Location: [facility] ENDOSCOPY;  Service: Gastroenterology;;   REPLACEMENT TOTAL KNEE BILATERAL Bilateral 11/11/2007    Social History[2]   Medication list has been reviewed and updated.  Active Medications[3]     07/18/2024    2:21 PM 04/18/2024    9:20 AM 01/11/2024    1:21 PM 10/12/2023    8:01 AM  GAD 7 : Generalized Anxiety Score  Nervous, Anxious, on Edge 0 0 0 2  Control/stop worrying 0 0 0 2  Worry too much - different things 0 0 0 1  Trouble relaxing 0 0 0 1  Restless 0 0 0 0  Easily annoyed or irritable 0 0 0 0  Afraid - awful might happen 0 0 0 0  Total GAD 7 Score 0 0 0 6  Anxiety Difficulty Not difficult at all Not difficult at all Not difficult at all Not difficult at all       07/18/2024    2:21 PM 04/18/2024    9:20 AM 01/11/2024    1:20 PM  Depression screen PHQ 2/9  Decreased Interest 0 0 0  Down,  Depressed, Hopeless 0 0 0  PHQ - 2 Score 0 0 0  Altered sleeping 0 0 0  Tired, decreased energy 0 0 0  Change in appetite 0 0 0  Feeling bad or failure about yourself  0 0 0  Trouble concentrating 0 0 0  Moving slowly or fidgety/restless 0 0 0  Suicidal thoughts 0 0 0  PHQ-9 Score 0  0  0   Difficult doing work/chores Not difficult at all Not difficult at all Not difficult at all     Data saved with a previous flowsheet row definition    BP Readings from Last 3 Encounters:  07/18/24 126/74  04/18/24 124/72  01/11/24 122/72    Physical Exam  Wt Readings from Last 3 Encounters:  07/18/24 (!) 317 lb (143.8 kg)  04/18/24 (!) 332 lb 8 oz (150.8 kg)  01/11/24 (!) 337 lb 4 oz (153 kg)    There were no vitals taken for this visit.  Assessment and Plan:  Problem List Items Addressed This Visit   None   No  follow-ups on file.    Leita HILARIO Adie, MD [facility] [place]           [1]  Allergies Allergen Reactions   Augmentin [Amoxicillin -Pot Clavulanate] Palpitations  [2]  Social History Tobacco Use   Smoking status: Never   Smokeless tobacco: Current    Types: Snuff  Vaping Use   Vaping status: Never Used  Substance Use Topics   Alcohol use: No   Drug use: No  [3]  No outpatient medications have been marked as taking for the 10/24/24 encounter (Orders Only) with Adie Leita DEL, MD.

## 2024-10-31 ENCOUNTER — Encounter: Payer: Self-pay | Admitting: Internal Medicine

## 2024-10-31 ENCOUNTER — Encounter: Admitting: Internal Medicine

## 2024-10-31 VITALS — BP 110/76 | HR 90 | Ht 72.0 in | Wt 307.0 lb

## 2024-10-31 DIAGNOSIS — Z125 Encounter for screening for malignant neoplasm of prostate: Secondary | ICD-10-CM

## 2024-10-31 DIAGNOSIS — Z6841 Body Mass Index (BMI) 40.0 and over, adult: Secondary | ICD-10-CM | POA: Diagnosis not present

## 2024-10-31 DIAGNOSIS — G4733 Obstructive sleep apnea (adult) (pediatric): Secondary | ICD-10-CM

## 2024-10-31 DIAGNOSIS — Z0000 Encounter for general adult medical examination without abnormal findings: Secondary | ICD-10-CM | POA: Diagnosis not present

## 2024-10-31 DIAGNOSIS — Z23 Encounter for immunization: Secondary | ICD-10-CM

## 2024-10-31 DIAGNOSIS — R7303 Prediabetes: Secondary | ICD-10-CM

## 2024-10-31 DIAGNOSIS — I10 Essential (primary) hypertension: Secondary | ICD-10-CM

## 2024-10-31 NOTE — Assessment & Plan Note (Signed)
 Doing well with Zepbound . Lab Results  Component Value Date   HGBA1C 5.4 10/12/2023

## 2024-10-31 NOTE — Assessment & Plan Note (Addendum)
 Now on Zepbound  10 mg. Weight loss so far 50+ lbs. Mild constipation managed with Metamucil and fluids

## 2024-10-31 NOTE — Assessment & Plan Note (Signed)
 Doing well on CPap therapy. Consider trying to get Zepbound  approved for this indication

## 2024-10-31 NOTE — Assessment & Plan Note (Signed)
 Well controlled blood pressure today. Current regimen is lisinopril . No medication side effects noted.

## 2024-10-31 NOTE — Progress Notes (Signed)
 "   Date:  10/31/2024   Name:  Matthew Schaefer   DOB:  1961/02/11   MRN:  [id number]   Chief Complaint: Annual Exam Matthew Schaefer is a 63 y.o. male who presents today for his Complete Annual Exam. He feels well. He reports exercising regularly. He reports he is sleeping well.   Health Maintenance  Topic Date Due   Zoster (Shingles) Vaccine (1 of 2) Never done   COVID-19 Vaccine (1 - 2025-26 season) Never done   Flu Shot  02/07/2025*   Pneumococcal Vaccine for age over 30 (1 of 1 - PCV) 07/18/2025*   Colon Cancer Screening  12/27/2028   DTaP/Tdap/Td vaccine (3 - Td or Tdap) 01/10/2034   Hepatitis C Screening  Addressed   HIV Screening  Addressed   Hepatitis B Vaccine  Aged Out   Meningitis B Vaccine  Aged Out  *Topic was postponed. The date shown is not the original due date.   Lab Results  Component Value Date   PSA1 0.5 10/12/2023   PSA1 0.4 10/06/2022   PSA1 0.4 09/30/2021   PSA 0.4 09/13/2014    Hypertension This is a chronic problem. The problem is controlled. Pertinent negatives include no chest pain, headaches, palpitations or shortness of breath. Past treatments include ACE inhibitors.    Review of Systems  Constitutional:  Negative for fatigue and unexpected weight change.  HENT:  Negative for nosebleeds.   Eyes:  Negative for visual disturbance.  Respiratory:  Negative for cough, chest tightness, shortness of breath and wheezing.   Cardiovascular:  Negative for chest pain, palpitations and leg swelling.  Gastrointestinal:  Negative for abdominal pain, constipation and diarrhea.  Neurological:  Negative for dizziness, weakness, light-headedness and headaches.     Lab Results  Component Value Date   NA 139 10/12/2023   K 4.5 10/12/2023   CO2 25 10/12/2023   GLUCOSE 97 10/12/2023   BUN 14 10/12/2023   CREATININE 1.02 10/12/2023   CALCIUM 9.2 10/12/2023   EGFR 82 10/12/2023   GFRNONAA 76 09/26/2020   Lab Results  Component Value Date   CHOL 162 10/12/2023    HDL 65 10/12/2023   LDLCALC 86 10/12/2023   TRIG 55 10/12/2023   CHOLHDL 2.5 10/12/2023   Lab Results  Component Value Date   TSH 1.200 10/12/2023   Lab Results  Component Value Date   HGBA1C 5.4 10/12/2023   Lab Results  Component Value Date   WBC 3.3 (L) 10/12/2023   HGB 15.6 10/12/2023   HCT 45.4 10/12/2023   MCV 89 10/12/2023   PLT 176 10/12/2023   Lab Results  Component Value Date   ALT 22 10/12/2023   AST 25 10/12/2023   ALKPHOS 74 10/12/2023   BILITOT 0.6 10/12/2023   No results found for: MARIEN BOLLS, VD25OH   Patient Active Problem List   Diagnosis Date Noted   Cervical strain 07/18/2024   History of colonic polyps 12/28/2023   Adenomatous polyp of colon 12/28/2023   Heart palpitations 10/12/2023   OSA (obstructive sleep apnea) 02/19/2023   Prediabetes 10/07/2022   BMI 50.0-59.9, adult (HCC) 03/03/2016   Essential hypertension 05/01/2015   Environmental and seasonal allergies 05/01/2015   GERD without esophagitis 05/01/2015   Osteoarthritis of spine 05/01/2015    Allergies[1]  Past Surgical History:  Procedure Laterality Date   COLONOSCOPY  11/10/2009   normal   COLONOSCOPY WITH PROPOFOL  N/A 11/16/2020   Procedure: COLONOSCOPY WITH PROPOFOL ;  Surgeon: Therisa Bi, MD;  Location: [facility]  ENDOSCOPY;  Service: Gastroenterology;  Laterality: N/A;   COLONOSCOPY WITH PROPOFOL  N/A 12/28/2023   Procedure: COLONOSCOPY WITH PROPOFOL ;  Surgeon: Therisa Bi, MD;  Location: [facility] ENDOSCOPY;  Service: Gastroenterology;  Laterality: N/A;   ESOPHAGOGASTRODUODENOSCOPY  11/11/2011   HH, gastritis, H Pylori (-)   JOINT REPLACEMENT  12/2007   bilateral knee   POLYPECTOMY  12/28/2023   Procedure: POLYPECTOMY;  Surgeon: Therisa Bi, MD;  Location: [facility] ENDOSCOPY;  Service: Gastroenterology;;   REPLACEMENT TOTAL KNEE BILATERAL Bilateral 11/11/2007    Social History[2]   Medication list has been reviewed and updated.  Active Medications[3]      10/31/2024    9:31 AM 07/18/2024    2:21 PM 04/18/2024    9:20 AM 01/11/2024    1:21 PM  GAD 7 : Generalized Anxiety Score  Nervous, Anxious, on Edge 0 0 0 0  Control/stop worrying 0 0 0 0  Worry too much - different things 0 0 0 0  Trouble relaxing 0 0 0 0  Restless 0 0 0 0  Easily annoyed or irritable 0 0 0 0  Afraid - awful might happen 0 0 0 0  Total GAD 7 Score 0 0 0 0  Anxiety Difficulty Not difficult at all Not difficult at all Not difficult at all Not difficult at all       10/31/2024    9:31 AM 10/31/2024    9:30 AM 07/18/2024    2:21 PM  Depression screen PHQ 2/9  Decreased Interest 0 0 0  Down, Depressed, Hopeless 0 0 0  PHQ - 2 Score 0 0 0  Altered sleeping 0 0 0  Tired, decreased energy 0 0 0  Change in appetite 0 0 0  Feeling bad or failure about yourself  0 0 0  Trouble concentrating 0 0 0  Moving slowly or fidgety/restless 0 0 0  Suicidal thoughts 0 0 0  PHQ-9 Score 0 0 0   Difficult doing work/chores Not difficult at all Not difficult at all Not difficult at all     Data saved with a previous flowsheet row definition    BP Readings from Last 3 Encounters:  10/31/24 110/76  07/18/24 126/74  04/18/24 124/72    Physical Exam Vitals and nursing note reviewed.  Constitutional:      Appearance: Normal appearance. He is well-developed.  HENT:     Head: Normocephalic.     Right Ear: Tympanic membrane, ear canal and external ear normal.     Left Ear: Tympanic membrane, ear canal and external ear normal.     Nose: Nose normal.  Eyes:     Conjunctiva/sclera: Conjunctivae normal.     Pupils: Pupils are equal, round, and reactive to light.  Neck:     Thyroid: No thyromegaly.     Vascular: No carotid bruit.  Cardiovascular:     Rate and Rhythm: Normal rate and regular rhythm.     Heart sounds: Normal heart sounds.  Pulmonary:     Effort: Pulmonary effort is normal.     Breath sounds: Normal breath sounds. No wheezing.  Chest:  Breasts:    Right: No mass.      Left: No mass.  Abdominal:     General: Bowel sounds are normal.     Palpations: Abdomen is soft.     Tenderness: There is no abdominal tenderness.  Musculoskeletal:        General: Normal range of motion.     Cervical back: Normal range of  motion and neck supple.     Right lower leg: No edema.     Left lower leg: No edema.  Lymphadenopathy:     Cervical: No cervical adenopathy.  Skin:    General: Skin is warm and dry.     Capillary Refill: Capillary refill takes less than 2 seconds.  Neurological:     General: No focal deficit present.     Mental Status: He is alert and oriented to person, place, and time.     Deep Tendon Reflexes: Reflexes are normal and symmetric.  Psychiatric:        Attention and Perception: Attention normal.        Mood and Affect: Mood normal.        Thought Content: Thought content normal.     Wt Readings from Last 3 Encounters:  10/31/24 (!) 307 lb (139.3 kg)  07/18/24 (!) 317 lb (143.8 kg)  04/18/24 (!) 332 lb 8 oz (150.8 kg)    BP 110/76   Pulse 90   Ht 6' (1.829 m)   Wt (!) 307 lb (139.3 kg)   SpO2 97%   BMI 41.64 kg/m   Assessment and Plan:  Problem List Items Addressed This Visit       Unprioritized   Essential hypertension (Chronic)   Well controlled blood pressure today. Current regimen is lisinopril . No medication side effects noted.        Relevant Orders   CBC with Differential/Platelet   Comprehensive metabolic panel with GFR   TSH   Urinalysis, Routine w reflex microscopic   BMI 50.0-59.9, adult (HCC)   Now on Zepbound  10 mg. Weight loss so far 50+ lbs. Mild constipation managed with Metamucil and fluids      Prediabetes (Chronic)   Doing well with Zepbound . Lab Results  Component Value Date   HGBA1C 5.4 10/12/2023         Relevant Orders   Hemoglobin A1c   OSA (obstructive sleep apnea)   Doing well on CPap therapy. Consider trying to get Zepbound  approved for this indication      Other Visit  Diagnoses       Annual physical exam    -  Primary   Relevant Orders   CBC with Differential/Platelet   Comprehensive metabolic panel with GFR   Hemoglobin A1c   Lipid panel   PSA   TSH   Urinalysis, Routine w reflex microscopic     Prostate cancer screening       Relevant Orders   PSA     Need for vaccination against Streptococcus pneumoniae       Relevant Orders   Pneumococcal conjugate vaccine 20-valent (Prevnar 20)       Return in about 3 months (around 01/29/2025) for HTN, zepbound .    Leita HILARIO Adie, MD [facility] [place]           [1]  Allergies Allergen Reactions   Augmentin [Amoxicillin -Pot Clavulanate] Palpitations  [2]  Social History Tobacco Use   Smoking status: Never   Smokeless tobacco: Current    Types: Snuff  Vaping Use   Vaping status: Never Used  Substance Use Topics   Alcohol use: No   Drug use: No  [3]  Current Meds  Medication Sig   fluticasone  (FLONASE ) 50 MCG/ACT nasal spray Place 2 sprays into both nostrils daily.   gabapentin  (NEURONTIN ) 100 MG capsule Take 1 capsule (100 mg total) by mouth daily as needed.  lisinopril  (ZESTRIL ) 20 MG tablet Take 1 tablet by mouth once daily   Loratadine (CLARITIN PO) Take 1 tablet by mouth as needed.   Multiple Vitamin (MULTIVITAMIN) tablet Take 1 tablet by mouth daily.   tirzepatide  10 MG/0.5ML injection vial Inject 10 mg into the skin once a week.   "

## 2024-11-01 ENCOUNTER — Ambulatory Visit: Payer: Self-pay | Admitting: Internal Medicine

## 2024-11-01 LAB — COMPREHENSIVE METABOLIC PANEL WITH GFR
ALT: 25 IU/L (ref 0–44)
AST: 29 IU/L (ref 0–40)
Albumin: 4.3 g/dL (ref 3.9–4.9)
Alkaline Phosphatase: 78 IU/L (ref 47–123)
BUN/Creatinine Ratio: 14 (ref 10–24)
BUN: 15 mg/dL (ref 8–27)
Bilirubin Total: 0.8 mg/dL (ref 0.0–1.2)
CO2: 23 mmol/L (ref 20–29)
Calcium: 9.3 mg/dL (ref 8.6–10.2)
Chloride: 104 mmol/L (ref 96–106)
Creatinine, Ser: 1.08 mg/dL (ref 0.76–1.27)
Globulin, Total: 2.4 g/dL (ref 1.5–4.5)
Glucose: 92 mg/dL (ref 70–99)
Potassium: 4.3 mmol/L (ref 3.5–5.2)
Sodium: 141 mmol/L (ref 134–144)
Total Protein: 6.7 g/dL (ref 6.0–8.5)
eGFR: 76 mL/min/1.73

## 2024-11-01 LAB — URINALYSIS, ROUTINE W REFLEX MICROSCOPIC
Bilirubin, UA: NEGATIVE
Glucose, UA: NEGATIVE
Ketones, UA: NEGATIVE
Leukocytes,UA: NEGATIVE
Nitrite, UA: NEGATIVE
RBC, UA: NEGATIVE
Specific Gravity, UA: 1.024 (ref 1.005–1.030)
Urobilinogen, Ur: 0.2 mg/dL (ref 0.2–1.0)
pH, UA: 5.5 (ref 5.0–7.5)

## 2024-11-01 LAB — LIPID PANEL
Chol/HDL Ratio: 2.3 ratio (ref 0.0–5.0)
Cholesterol, Total: 153 mg/dL (ref 100–199)
HDL: 67 mg/dL
LDL Chol Calc (NIH): 76 mg/dL (ref 0–99)
Triglycerides: 44 mg/dL (ref 0–149)
VLDL Cholesterol Cal: 10 mg/dL (ref 5–40)

## 2024-11-01 LAB — CBC WITH DIFFERENTIAL/PLATELET
Basophils Absolute: 0 x10E3/uL (ref 0.0–0.2)
Basos: 1 %
EOS (ABSOLUTE): 0.1 x10E3/uL (ref 0.0–0.4)
Eos: 2 %
Hematocrit: 49.3 % (ref 37.5–51.0)
Hemoglobin: 16.4 g/dL (ref 13.0–17.7)
Immature Grans (Abs): 0 x10E3/uL (ref 0.0–0.1)
Immature Granulocytes: 0 %
Lymphocytes Absolute: 1.3 x10E3/uL (ref 0.7–3.1)
Lymphs: 42 %
MCH: 29.4 pg (ref 26.6–33.0)
MCHC: 33.3 g/dL (ref 31.5–35.7)
MCV: 89 fL (ref 79–97)
Monocytes Absolute: 0.6 x10E3/uL (ref 0.1–0.9)
Monocytes: 17 %
Neutrophils Absolute: 1.2 x10E3/uL — ABNORMAL LOW (ref 1.4–7.0)
Neutrophils: 38 %
Platelets: 186 x10E3/uL (ref 150–450)
RBC: 5.57 x10E6/uL (ref 4.14–5.80)
RDW: 13.1 % (ref 11.6–15.4)
WBC: 3.2 x10E3/uL — ABNORMAL LOW (ref 3.4–10.8)

## 2024-11-01 LAB — PSA: Prostate Specific Ag, Serum: 0.4 ng/mL (ref 0.0–4.0)

## 2024-11-01 LAB — HEMOGLOBIN A1C
Est. average glucose Bld gHb Est-mCnc: 103 mg/dL
Hgb A1c MFr Bld: 5.2 % (ref 4.8–5.6)

## 2024-11-01 LAB — TSH: TSH: 1.21 u[IU]/mL (ref 0.450–4.500)

## 2024-11-28 ENCOUNTER — Other Ambulatory Visit: Payer: Self-pay

## 2024-11-28 DIAGNOSIS — Z6841 Body Mass Index (BMI) 40.0 and over, adult: Secondary | ICD-10-CM

## 2024-11-28 MED ORDER — TIRZEPATIDE-WEIGHT MANAGEMENT 10 MG/0.5ML ~~LOC~~ SOLN: 10.0000 mg | SUBCUTANEOUS | 0 refills | Status: AC

## 2024-12-16 ENCOUNTER — Other Ambulatory Visit: Payer: Self-pay | Admitting: Physician Assistant

## 2024-12-16 DIAGNOSIS — Z6841 Body Mass Index (BMI) 40.0 and over, adult: Secondary | ICD-10-CM

## 2025-01-30 ENCOUNTER — Ambulatory Visit: Admitting: Physician Assistant
